# Patient Record
Sex: Female | Born: 1965 | Race: White | Hispanic: No | Smoking: Never smoker
Health system: Southern US, Community
[De-identification: ages and names within clinical notes are randomized; demographics above are authoritative.]

## PROBLEM LIST (undated history)

## (undated) DIAGNOSIS — I10 Essential (primary) hypertension: Secondary | ICD-10-CM

## (undated) DIAGNOSIS — M109 Gout, unspecified: Secondary | ICD-10-CM

## (undated) DIAGNOSIS — F419 Anxiety disorder, unspecified: Secondary | ICD-10-CM

## (undated) DIAGNOSIS — N200 Calculus of kidney: Secondary | ICD-10-CM

## (undated) DIAGNOSIS — K5792 Diverticulitis of intestine, part unspecified, without perforation or abscess without bleeding: Secondary | ICD-10-CM

## (undated) DIAGNOSIS — F329 Major depressive disorder, single episode, unspecified: Secondary | ICD-10-CM

## (undated) DIAGNOSIS — E119 Type 2 diabetes mellitus without complications: Secondary | ICD-10-CM

## (undated) DIAGNOSIS — F32A Depression, unspecified: Secondary | ICD-10-CM

## (undated) HISTORY — PX: CHOLECYSTECTOMY: SHX55

## (undated) HISTORY — PX: NASAL SINUS SURGERY: SHX719

## (undated) HISTORY — DX: Diverticulitis of intestine, part unspecified, without perforation or abscess without bleeding: K57.92

## (undated) HISTORY — PX: ELBOW SURGERY: SHX618

## (undated) HISTORY — PX: OTHER SURGICAL HISTORY: SHX169

## (undated) HISTORY — PX: ABDOMINAL HYSTERECTOMY: SHX81

---

## 2017-12-21 ENCOUNTER — Ambulatory Visit: Payer: Self-pay | Admitting: Unknown Physician Specialty

## 2017-12-31 ENCOUNTER — Encounter: Payer: Self-pay | Admitting: Emergency Medicine

## 2017-12-31 ENCOUNTER — Other Ambulatory Visit: Payer: Self-pay

## 2017-12-31 ENCOUNTER — Emergency Department
Admission: EM | Admit: 2017-12-31 | Discharge: 2017-12-31 | Disposition: A | Discharge status: Home / Self Care | Payer: Medicare HMO | Attending: Emergency Medicine | Admitting: Emergency Medicine

## 2017-12-31 ENCOUNTER — Emergency Department: Payer: Medicare HMO

## 2017-12-31 DIAGNOSIS — I10 Essential (primary) hypertension: Secondary | ICD-10-CM | POA: Diagnosis not present

## 2017-12-31 DIAGNOSIS — E119 Type 2 diabetes mellitus without complications: Secondary | ICD-10-CM | POA: Insufficient documentation

## 2017-12-31 DIAGNOSIS — N200 Calculus of kidney: Secondary | ICD-10-CM | POA: Diagnosis not present

## 2017-12-31 DIAGNOSIS — N939 Abnormal uterine and vaginal bleeding, unspecified: Secondary | ICD-10-CM | POA: Diagnosis present

## 2017-12-31 HISTORY — DX: Depression, unspecified: F32.A

## 2017-12-31 HISTORY — DX: Gout, unspecified: M10.9

## 2017-12-31 HISTORY — DX: Major depressive disorder, single episode, unspecified: F32.9

## 2017-12-31 HISTORY — DX: Calculus of kidney: N20.0

## 2017-12-31 HISTORY — DX: Essential (primary) hypertension: I10

## 2017-12-31 HISTORY — DX: Anxiety disorder, unspecified: F41.9

## 2017-12-31 HISTORY — DX: Type 2 diabetes mellitus without complications: E11.9

## 2017-12-31 LAB — URINALYSIS, COMPLETE (UACMP) WITH MICROSCOPIC
BILIRUBIN URINE: NEGATIVE
Glucose, UA: NEGATIVE mg/dL
KETONES UR: NEGATIVE mg/dL
LEUKOCYTES UA: NEGATIVE
NITRITE: NEGATIVE
PROTEIN: NEGATIVE mg/dL
Specific Gravity, Urine: 1.014 (ref 1.005–1.030)
pH: 5 (ref 5.0–8.0)

## 2017-12-31 LAB — WET PREP, GENITAL
Clue Cells Wet Prep HPF POC: NONE SEEN
Sperm: NONE SEEN
TRICH WET PREP: NONE SEEN
Yeast Wet Prep HPF POC: NONE SEEN

## 2017-12-31 LAB — CHLAMYDIA/NGC RT PCR (ARMC ONLY)
Chlamydia Tr: NOT DETECTED
N gonorrhoeae: NOT DETECTED

## 2017-12-31 LAB — POCT PREGNANCY, URINE: PREG TEST UR: NEGATIVE

## 2017-12-31 NOTE — ED Notes (Signed)
Signature pad not working, pt verbalizes understanding of discharge and follow up instructions 

## 2017-12-31 NOTE — ED Triage Notes (Signed)
Pt to ED c/o vaginal bleeding last night but none today, denies clots.  States had complete hysterectomy 9 years ago.  Denies weakness or lightheaded, skin color appropriate.

## 2017-12-31 NOTE — ED Notes (Signed)
Pt presents today for vaginal bleeding that started this morning. Pt denies clots and states she had a hysterectomy about 9 yrs ago;.Marland Kitchen

## 2017-12-31 NOTE — ED Provider Notes (Signed)
[facility] Emergency Department Provider Note       Time seen: ----------------------------------------- 6:03 PM on 12/31/2017 -----------------------------------------   I have reviewed the triage vital signs and the nursing notes.  HISTORY   Chief Complaint Vaginal Bleeding    HPI Wendy Rivas is a 52 y.o. female with a history of anxiety, depression, diabetes, gout, hypertension and kidney stones who presents to the ED for last night but none today.  She denies any clots.  Patient states she had a complete hysterectomy 9 years ago.  She denies weakness, lightheadedness or other complaints at this time.  Past Medical History:  Diagnosis Date  . Anxiety   . Depression   . Diabetes mellitus without complication (HCC)   . Gout   . Hypertension   . Kidney stones     There are no active problems to display for this patient.   Past Surgical History:  Procedure Laterality Date  . ABDOMINAL HYSTERECTOMY    . CESAREAN SECTION    . CHOLECYSTECTOMY    . ELBOW SURGERY      Allergies Januvia [sitagliptin]  Social History Social History   Tobacco Use  . Smoking status: Never Smoker  . Smokeless tobacco: Never Used  Substance Use Topics  . Alcohol use: No    Frequency: Never  . Drug use: No    Review of Systems Constitutional: Negative for fever. Cardiovascular: Negative for chest pain. Respiratory: Negative for shortness of breath. Gastrointestinal: Negative for abdominal pain, vomiting and diarrhea. Genitourinary: Positive for vaginal bleeding Musculoskeletal: Negative for back pain. Skin: Negative for rash. Neurological: Negative for headaches, focal weakness or numbness.  All systems negative/normal/unremarkable except as stated in the HPI  ____________________________________________   PHYSICAL EXAM:  VITAL SIGNS: ED Triage Vitals  Enc Vitals Group     BP 12/31/17 1754 126/81     Pulse Rate 12/31/17 1754 81     Resp  12/31/17 1754 16     Temp 12/31/17 1754 98.9 F (37.2 C)     Temp Source 12/31/17 1754 Oral     SpO2 12/31/17 1754 95 %     Weight 12/31/17 1755 185 lb (83.9 kg)     Height 12/31/17 1755 5\' 8"  (1.727 m)     Head Circumference --      Peak Flow --      Pain Score 12/31/17 1755 3     Pain Loc --      Pain Edu? --      Excl. in GC? --     Constitutional: Alert and oriented. Well appearing and in no distress. Eyes: Conjunctivae are normal. Normal extraocular movements. Cardiovascular: Normal rate, regular rhythm. No murmurs, rubs, or gallops. Respiratory: Normal respiratory effort without tachypnea nor retractions. Breath sounds are clear and equal bilaterally. No wheezes/rales/rhonchi. Gastrointestinal: Soft and nontender. Normal bowel sounds Genitourinary: No vaginal bleeding, no vaginal irritation or tears.  Vaginal cuff is intact, scant vaginal discharge Musculoskeletal: Nontender with normal range of motion in extremities. No lower extremity tenderness nor edema. Neurologic:  Normal speech and language. No gross focal neurologic deficits are appreciated.  Skin:  Skin is warm, dry and intact. No rash noted. Psychiatric: Mood and affect are normal. Speech and behavior are normal.  ____________________________________________  ED COURSE:  As part of my medical decision making, I reviewed the following data within the electronic MEDICAL RECORD NUMBER History obtained from family if available, nursing notes, old chart and ekg, as well as notes from prior ED  visits. Patient presented for vaginal bleeding, we will assess with labs and imaging as indicated at this time.   Procedures ____________________________________________   LABS (pertinent positives/negatives)  Labs Reviewed  WET PREP, GENITAL - Abnormal; Notable for the following components:      Result Value   WBC, Wet Prep HPF POC FEW (*)    All other components within normal limits  CHLAMYDIA/NGC RT PCR ([facility] ONLY)   POCT PREGNANCY, URINE    RADIOLOGY Images were viewed by me  Pelvic ultrasound is unremarkable  ____________________________________________  DIFFERENTIAL DIAGNOSIS   Vaginal dehiscence, vaginal laceration, urinary bleeding, rectal bleeding  FINAL ASSESSMENT AND PLAN  Abnormal vaginal bleeding   Plan: Patient had presented for abnormal vaginal bleeding. Patient's labs are unremarkable. Patient's imaging was also unremarkable.  No clear etiology for her symptoms.  She is cleared for outpatient follow-up.   Ulice Dash, MD   Note: This note was generated in part or whole with voice recognition software. Voice recognition is usually quite accurate but there are transcription errors that can and very often do occur. I apologize for any typographical errors that were not detected and corrected.     Emily Filbert, MD 12/31/17 [phone]

## 2018-01-02 LAB — URINE CULTURE: SPECIAL REQUESTS: NORMAL

## 2018-02-20 ENCOUNTER — Encounter: Payer: Self-pay | Admitting: Emergency Medicine

## 2018-02-20 ENCOUNTER — Emergency Department: Payer: Medicare HMO

## 2018-02-20 ENCOUNTER — Emergency Department
Admission: EM | Admit: 2018-02-20 | Discharge: 2018-02-20 | Disposition: A | Discharge status: Home / Self Care | Payer: Medicare HMO | Attending: Emergency Medicine | Admitting: Emergency Medicine

## 2018-02-20 DIAGNOSIS — M25552 Pain in left hip: Secondary | ICD-10-CM

## 2018-02-20 DIAGNOSIS — M7062 Trochanteric bursitis, left hip: Secondary | ICD-10-CM | POA: Insufficient documentation

## 2018-02-20 DIAGNOSIS — Y9301 Activity, walking, marching and hiking: Secondary | ICD-10-CM | POA: Diagnosis not present

## 2018-02-20 DIAGNOSIS — I10 Essential (primary) hypertension: Secondary | ICD-10-CM | POA: Diagnosis not present

## 2018-02-20 DIAGNOSIS — E119 Type 2 diabetes mellitus without complications: Secondary | ICD-10-CM | POA: Diagnosis not present

## 2018-02-20 LAB — URINALYSIS, COMPLETE (UACMP) WITH MICROSCOPIC
Bacteria, UA: NONE SEEN
Bilirubin Urine: NEGATIVE
Glucose, UA: NEGATIVE mg/dL
Hgb urine dipstick: NEGATIVE
Ketones, ur: NEGATIVE mg/dL
Leukocytes, UA: NEGATIVE
Nitrite: NEGATIVE
Protein, ur: NEGATIVE mg/dL
Specific Gravity, Urine: 1.017 (ref 1.005–1.030)
pH: 5 (ref 5.0–8.0)

## 2018-02-20 MED ORDER — PREDNISONE 10 MG PO TABS: 10.0000 mg | ORAL_TABLET | Freq: Every day | ORAL | 0 refills | Status: DC

## 2018-02-20 MED ORDER — KETOROLAC TROMETHAMINE 30 MG/ML IJ SOLN
30.0000 mg | Freq: Once | INTRAMUSCULAR | Status: AC
  Administered 2018-02-20: 30 mg via INTRAMUSCULAR
  Filled 2018-02-20: qty 1

## 2018-02-20 NOTE — Discharge Instructions (Addendum)
Please apply heating pad to the left hip.  Take prednisone as prescribed.  After prednisone take ibuprofen 600 mg 3 times daily with food.  Follow-up with orthopedics if no improvement 1 week.

## 2018-02-20 NOTE — ED Triage Notes (Signed)
Pt reports pain to left hip for the past few days, hurts worse when she walks and stands.

## 2018-02-20 NOTE — ED Provider Notes (Signed)
[facility] EMERGENCY DEPARTMENT Provider Note   CSN: [id number] Arrival date & time: 02/20/18  4098     History   Chief Complaint Chief Complaint  Patient presents with  . Hip Pain  . Pelvic Pain    HPI Wendy Rivas is a 52 y.o. female.  Presents to the emergency department for evaluation of left lateral hip pain, left groin pain.  Symptoms have been present on and off for 1 year but over the last 3 weeks she has been working 9-hour shifts daily with no breaks.  Walking on concrete for 9 hours has caused increased pain.  She has not had a day off in 3 weeks.  She describes aching pain in the left groin and left lateral hip.  She also has a pain at nighttime along the left trochanteric bursa.  She denies any back pain, trauma, injury, numbness, tingling, radicular symptoms.  She has taken ibuprofen with only mild improvement.  She is able to ambulate with no assistive devices.  She denies any abdominal pain, flank pain, urinary symptoms.  HPI  Past Medical History:  Diagnosis Date  . Anxiety   . Depression   . Diabetes mellitus without complication (HCC)   . Gout   . Hypertension   . Kidney stones     There are no active problems to display for this patient.   Past Surgical History:  Procedure Laterality Date  . ABDOMINAL HYSTERECTOMY    . CESAREAN SECTION    . CHOLECYSTECTOMY    . ELBOW SURGERY       OB History   None      Home Medications    Prior to Admission medications   Medication Sig Start Date End Date Taking? Authorizing Provider  predniSONE (DELTASONE) 10 MG tablet Take 1 tablet (10 mg total) by mouth daily. 6,5,4,3,2,1 six day taper 02/20/18   Evon Slack, PA-C    Family History No family history on file.  Social History Social History   Tobacco Use  . Smoking status: Never Smoker  . Smokeless tobacco: Never Used  Substance Use Topics  . Alcohol use: No    Frequency: Never  . Drug use: No     Allergies     Januvia [sitagliptin]   Review of Systems Review of Systems  Constitutional: Negative for fever.  Respiratory: Negative for shortness of breath.   Cardiovascular: Negative for chest pain.  Gastrointestinal: Negative for abdominal pain.  Genitourinary: Negative for difficulty urinating, dysuria and urgency.  Musculoskeletal: Positive for arthralgias. Negative for back pain, gait problem, joint swelling and myalgias.  Skin: Negative for rash.  Neurological: Negative for dizziness and headaches.     Physical Exam Updated Vital Signs BP 118/79 (BP Location: Left Arm)   Pulse 73   Temp 98.6 F (37 C) (Oral)   Resp 20   Ht 5\' 8"  (1.727 m)   Wt 86.2 kg (190 lb)   SpO2 96%   BMI 28.89 kg/m   Physical Exam  Constitutional: She is oriented to person, place, and time. She appears well-developed and well-nourished.  HENT:  Head: Normocephalic and atraumatic.  Eyes: Conjunctivae are normal.  Neck: Normal range of motion.  Cardiovascular: Normal rate.  Pulmonary/Chest: Effort normal. No respiratory distress.  Musculoskeletal:  Examination lumbar spine shows no spinous process tenderness.  She has tenderness of the left hip trochanter bursa with slight pain with left hip internal rotation.  She has full range of motion of left hip she  is nervous intact in bilateral lower extremities.  She is nontender along the sciatic notches, sacral region.  Neurological: She is alert and oriented to person, place, and time.  Skin: Skin is warm. No rash noted.  Psychiatric: She has a normal mood and affect. Her behavior is normal. Thought content normal.     ED Treatments / Results  Labs (all labs ordered are listed, but only abnormal results are displayed) Labs Reviewed  URINALYSIS, COMPLETE (UACMP) WITH MICROSCOPIC - Abnormal; Notable for the following components:      Result Value   Color, Urine YELLOW (*)    APPearance HAZY (*)    Squamous Epithelial / LPF 6-30 (*)    All other  components within normal limits    EKG None  Radiology Dg Hip Unilat W Or Wo Pelvis 2-3 Views Left  Result Date: 02/20/2018 CLINICAL DATA:  52 year old female with pain radiating from the left groin to the lateral hip after beginning new job and walking on concrete floors. EXAM: DG HIP (WITH OR WITHOUT PELVIS) 2-3V LEFT COMPARISON:  None. FINDINGS: Bone mineralization is within normal limits. Femoral heads are normally located. The hip joint spaces appear symmetric and within normal limits. Intact pelvis. Sacral ala and SI joints appear normal. Incidental pelvic phleboliths. Proximal left femur appears intact and normal. There are degenerative changes in the lower lumbar spine. Right lower quadrant surgical clip incidentally noted. Negative visible bowel gas pattern. IMPRESSION: 1. Normal for age radiographic appearance of the left hip and pelvis. 2. Lower lumbar spine degeneration. Electronically Signed   By: Odessa FlemingH  Hall M.D.   On: 02/20/2018 10:03    Procedures Procedures (including critical care time)  Medications Ordered in ED Medications  ketorolac (TORADOL) 30 MG/ML injection 30 mg (30 mg Intramuscular Given 02/20/18 1002)     Initial Impression / Assessment and Plan / ED Course  I have reviewed the triage vital signs and the nursing notes.  Pertinent labs & imaging results that were available during my care of the patient were reviewed by me and considered in my medical decision making (see chart for details).     52 year old female with left hip pain.  She points to the left hip trochanteric bursa as well as the groin.  Normal range of motion of left hip with only mild discomfort with hip internal rotation.  She is point tender over the trochanteric bursa.  Patient had x-rays today, these reviewed by me today showing mild arthritic changes present.  Patient also urinalysis which was reviewed by me today showing no sign of blood or infection.  Physical exam and urinalysis not consistent  with kidney stone.  Patient started on 6-day steroid taper.  She will be given a note to remain out of work today.  She has the next 2 days off and will rest.  She will follow with orthopedics if no improvement in 1 week.  She will avoid lying on left hip at nighttime.  Final Clinical Impressions(s) / ED Diagnoses   Final diagnoses:  Left hip pain  Trochanteric bursitis of left hip    ED Discharge Orders        Ordered    predniSONE (DELTASONE) 10 MG tablet  Daily     02/20/18 1024       Ronnette JuniperGaines, Thomas C, PA-C 02/20/18 1030    Darci CurrentBrown, [place] N, MD 02/20/18 1043

## 2018-02-20 NOTE — ED Notes (Signed)

## 2018-04-01 ENCOUNTER — Emergency Department: Payer: Medicare HMO

## 2018-04-01 ENCOUNTER — Emergency Department
Admission: EM | Admit: 2018-04-01 | Discharge: 2018-04-01 | Disposition: A | Discharge status: Home / Self Care | Payer: Medicare HMO | Attending: Emergency Medicine | Admitting: Emergency Medicine

## 2018-04-01 ENCOUNTER — Other Ambulatory Visit: Payer: Self-pay

## 2018-04-01 ENCOUNTER — Encounter: Payer: Self-pay | Admitting: Emergency Medicine

## 2018-04-01 DIAGNOSIS — E119 Type 2 diabetes mellitus without complications: Secondary | ICD-10-CM | POA: Diagnosis not present

## 2018-04-01 DIAGNOSIS — K5792 Diverticulitis of intestine, part unspecified, without perforation or abscess without bleeding: Secondary | ICD-10-CM | POA: Diagnosis not present

## 2018-04-01 DIAGNOSIS — I10 Essential (primary) hypertension: Secondary | ICD-10-CM | POA: Insufficient documentation

## 2018-04-01 DIAGNOSIS — R1032 Left lower quadrant pain: Secondary | ICD-10-CM | POA: Diagnosis present

## 2018-04-01 LAB — COMPREHENSIVE METABOLIC PANEL
ALBUMIN: 4 g/dL (ref 3.5–5.0)
ALT: 13 U/L — ABNORMAL LOW (ref 14–54)
ANION GAP: 10 (ref 5–15)
AST: 36 U/L (ref 15–41)
Alkaline Phosphatase: 51 U/L (ref 38–126)
BUN: 10 mg/dL (ref 6–20)
CO2: 25 mmol/L (ref 22–32)
Calcium: 8.6 mg/dL — ABNORMAL LOW (ref 8.9–10.3)
Chloride: 104 mmol/L (ref 101–111)
Creatinine, Ser: 0.77 mg/dL (ref 0.44–1.00)
GFR calc Af Amer: >60 mL/min (ref >60)
GFR calc non Af Amer: >60 mL/min (ref >60)
GLUCOSE: 226 mg/dL — AB (ref 65–99)
POTASSIUM: 3.7 mmol/L (ref 3.5–5.1)
Sodium: 139 mmol/L (ref 135–145)
TOTAL PROTEIN: 6.7 g/dL (ref 6.5–8.1)
Total Bilirubin: 0.8 mg/dL (ref 0.3–1.2)

## 2018-04-01 LAB — CBC WITH DIFFERENTIAL/PLATELET
BASOS ABS: 0 10*3/uL (ref 0–0.1)
BASOS PCT: 1 %
Eosinophils Absolute: 0.2 10*3/uL (ref 0–0.7)
Eosinophils Relative: 2 %
HEMATOCRIT: 33.6 % — AB (ref 35.0–47.0)
Hemoglobin: 11.8 g/dL — ABNORMAL LOW (ref 12.0–16.0)
Lymphocytes Relative: 18 %
Lymphs Abs: 1.2 10*3/uL (ref 1.0–3.6)
MCH: 30 pg (ref 26.0–34.0)
MCHC: 35.1 g/dL (ref 32.0–36.0)
MCV: 85.4 fL (ref 80.0–100.0)
MONO ABS: 0.4 10*3/uL (ref 0.2–0.9)
Monocytes Relative: 6 %
NEUTROS ABS: 4.8 10*3/uL (ref 1.4–6.5)
Neutrophils Relative %: 73 %
PLATELETS: 227 10*3/uL (ref 150–440)
RBC: 3.93 MIL/uL (ref 3.80–5.20)
RDW: 13.2 % (ref 11.5–14.5)
WBC: 6.6 10*3/uL (ref 3.6–11.0)

## 2018-04-01 LAB — URINALYSIS, COMPLETE (UACMP) WITH MICROSCOPIC
BACTERIA UA: NONE SEEN
BILIRUBIN URINE: NEGATIVE
Glucose, UA: NEGATIVE mg/dL
HGB URINE DIPSTICK: NEGATIVE
Ketones, ur: NEGATIVE mg/dL
LEUKOCYTES UA: NEGATIVE
NITRITE: NEGATIVE
Protein, ur: NEGATIVE mg/dL
SPECIFIC GRAVITY, URINE: 1.016 (ref 1.005–1.030)
pH: 5 (ref 5.0–8.0)

## 2018-04-01 LAB — LIPASE, BLOOD: Lipase: 38 U/L (ref 11–51)

## 2018-04-01 MED ORDER — RANITIDINE HCL 150 MG PO TABS: 150.0000 mg | ORAL_TABLET | Freq: Two times a day (BID) | ORAL | 2 refills | Status: DC

## 2018-04-01 MED ORDER — SERTRALINE HCL 100 MG PO TABS: 100.0000 mg | ORAL_TABLET | Freq: Every day | ORAL | 2 refills | Status: DC

## 2018-04-01 MED ORDER — METRONIDAZOLE 500 MG PO TABS: 500.0000 mg | ORAL_TABLET | Freq: Three times a day (TID) | ORAL | 0 refills | Status: DC

## 2018-04-01 MED ORDER — ALLOPURINOL 100 MG PO TABS: 100.0000 mg | ORAL_TABLET | Freq: Every day | ORAL | 3 refills | Status: DC

## 2018-04-01 MED ORDER — LISINOPRIL 2.5 MG PO TABS: 2.5000 mg | ORAL_TABLET | Freq: Every day | ORAL | 3 refills | Status: DC

## 2018-04-01 MED ORDER — KETOROLAC TROMETHAMINE 30 MG/ML IJ SOLN
30.0000 mg | Freq: Once | INTRAMUSCULAR | Status: AC
  Administered 2018-04-01: 30 mg via INTRAVENOUS
  Filled 2018-04-01: qty 1

## 2018-04-01 MED ORDER — OXYCODONE-ACETAMINOPHEN 5-325 MG PO TABS: 1.0000 | ORAL_TABLET | Freq: Three times a day (TID) | ORAL | 0 refills | Status: DC | PRN

## 2018-04-01 MED ORDER — TRAZODONE HCL 50 MG PO TABS: 100.0000 mg | ORAL_TABLET | Freq: Every day | ORAL | 2 refills | Status: DC

## 2018-04-01 MED ORDER — CIPROFLOXACIN HCL 500 MG PO TABS: 500.0000 mg | ORAL_TABLET | Freq: Two times a day (BID) | ORAL | 0 refills | Status: AC

## 2018-04-01 NOTE — ED Provider Notes (Signed)
[facility] Emergency Department Provider Note       Time seen: ----------------------------------------- 9:25 AM on 04/01/2018 -----------------------------------------   I have reviewed the triage vital signs and the nursing notes.  HISTORY   Chief Complaint Abdominal Pain    HPI Wendy Rivas is a 52 y.o. female with a history of anxiety, depression, diabetes, gout, hypertension and kidney stones who presents to the ED for abdominal pain that started yesterday.  Patient states she had nausea but no vomiting or diarrhea.  She denies any fever, she does report some dysuria and urinary frequency.  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  She reports pain is worse with movement, worse on the left side but she is also having right lower quadrant pain.  Past Medical History:  Diagnosis Date  . Anxiety   . Depression   . Diabetes mellitus without complication (HCC)   . Gout   . Hypertension   . Kidney stones     There are no active problems to display for this patient.   Past Surgical History:  Procedure Laterality Date  . ABDOMINAL HYSTERECTOMY    . CESAREAN SECTION    . CHOLECYSTECTOMY    . ELBOW SURGERY      Allergies Januvia [sitagliptin]  Social History Social History   Tobacco Use  . Smoking status: Never Smoker  . Smokeless tobacco: Never Used  Substance Use Topics  . Alcohol use: No    Frequency: Never  . Drug use: No   Review of Systems Constitutional: Negative for fever. Cardiovascular: Negative for chest pain. Respiratory: Negative for shortness of breath. Gastrointestinal: Positive for abdominal pain Genitourinary: Positive for dysuria Musculoskeletal: Positive for back pain Skin: Negative for rash. Neurological: Negative for headaches, focal weakness or numbness.  All systems negative/normal/unremarkable except as  stated in the HPI  ____________________________________________   PHYSICAL EXAM:  VITAL SIGNS: ED Triage Vitals  Enc Vitals Group     BP 04/01/18 0908 123/74     Pulse Rate 04/01/18 0908 84     Resp 04/01/18 0908 18     Temp 04/01/18 0908 98.3 F (36.8 C)     Temp Source 04/01/18 0908 Oral     SpO2 04/01/18 0908 95 %     Weight 04/01/18 0915 190 lb (86.2 kg)     Height 04/01/18 0915  (1.727 m)     Head Circumference --      Peak Flow --      Pain Score 04/01/18 0914 9     Pain Loc --      Pain Edu? --      Excl. in GC? --    Constitutional: Alert and oriented. Well appearing and in no distress. Eyes: Conjunctivae are normal. Normal extraocular movements. ENT   Head: Normocephalic and atraumatic.   Nose: No congestion/rhinnorhea.   Mouth/Throat: Mucous membranes are moist.   Neck: No stridor. Cardiovascular: Normal rate, regular rhythm. No murmurs, rubs, or gallops. Respiratory: Normal respiratory effort without tachypnea nor retractions. Breath sounds are clear and equal bilaterally. No wheezes/rales/rhonchi. Gastrointestinal: Left flank tenderness, no rebound or guarding.  Normal bowel sounds. Musculoskeletal: Nontender with normal range of motion in extremities. No lower extremity tenderness nor edema. Neurologic:  Normal speech and language. No gross focal neurologic deficits are appreciated.  Skin:  Skin is warm, dry and intact. No rash noted. Psychiatric: Mood and affect are  normal. Speech and behavior are normal.  ____________________________________________  ED COURSE:  As part of my medical decision making, I reviewed the following data within the electronic MEDICAL RECORD NUMBER History obtained from family if available, nursing notes, old chart and ekg, as well as notes from prior ED visits. Patient presented for flank pain, we will assess with labs and imaging as indicated at this time.   Procedures ____________________________________________    LABS (pertinent positives/negatives)  Labs Reviewed  CBC WITH DIFFERENTIAL/PLATELET - Abnormal; Notable for the following components:      Result Value   Hemoglobin 11.8 (*)    HCT 33.6 (*)    All other components within normal limits  COMPREHENSIVE METABOLIC PANEL - Abnormal; Notable for the following components:   Glucose, Bld 226 (*)    Calcium 8.6 (*)    ALT 13 (*)    All other components within normal limits  URINALYSIS, COMPLETE (UACMP) WITH MICROSCOPIC - Abnormal; Notable for the following components:   Color, Urine YELLOW (*)    APPearance CLEAR (*)    All other components within normal limits  LIPASE, BLOOD    RADIOLOGY Images were viewed by me  cT renal protocol IMPRESSION: 1. Extensive colonic diverticulosis with distal descending diverticulitis, no abscess. 2. Left renal parenchymal loss suggesting renal artery stenosis or prior insult. 3. Right nephrolithiasis.  No hydronephrosis.  ____________________________________________  DIFFERENTIAL DIAGNOSIS   Renal colic, UTI, pyelonephritis, muscle strain  FINAL ASSESSMENT AND PLAN  Diverticulitis, medication refill   Plan: The patient had presented for flank pain. Patient's labs are grossly unremarkable except for mild hyperglycemia. Patient's imaging did reveal distal descending diverticulitis without abscess.  She was started on Cipro and Flagyl as well as pain medicine.  She is cleared for outpatient follow-up.  I have also refilled some of her chronic medications.   Ulice Dash, MD   Note: This note was generated in part or whole with voice recognition software. Voice recognition is usually quite accurate but there are transcription errors that can and very often do occur. I apologize for any typographical errors that were not detected and corrected.     Emily Filbert, MD 04/01/18 1224

## 2018-04-01 NOTE — ED Triage Notes (Signed)
Pt to ED via POV c/o LLQ abdominal pain that started yesterday. Pt states that she has had nausea but no vomiting or diarrhea. Pt denies fever. Pt states that she has had some dysuria and frequency. Pt reports hx/o kidney stones, states that does not feel like her pain from kidney stones. Pt reports that her pain is worse with movement. Pt is in NAD at this time.

## 2018-04-05 ENCOUNTER — Encounter: Payer: Self-pay | Admitting: Emergency Medicine

## 2018-04-05 ENCOUNTER — Other Ambulatory Visit: Payer: Self-pay

## 2018-04-05 DIAGNOSIS — Z79899 Other long term (current) drug therapy: Secondary | ICD-10-CM | POA: Insufficient documentation

## 2018-04-05 DIAGNOSIS — E119 Type 2 diabetes mellitus without complications: Secondary | ICD-10-CM | POA: Diagnosis not present

## 2018-04-05 DIAGNOSIS — K5732 Diverticulitis of large intestine without perforation or abscess without bleeding: Secondary | ICD-10-CM | POA: Diagnosis not present

## 2018-04-05 DIAGNOSIS — F419 Anxiety disorder, unspecified: Secondary | ICD-10-CM | POA: Insufficient documentation

## 2018-04-05 DIAGNOSIS — F329 Major depressive disorder, single episode, unspecified: Secondary | ICD-10-CM | POA: Insufficient documentation

## 2018-04-05 DIAGNOSIS — R1032 Left lower quadrant pain: Secondary | ICD-10-CM | POA: Diagnosis present

## 2018-04-05 DIAGNOSIS — I10 Essential (primary) hypertension: Secondary | ICD-10-CM | POA: Diagnosis not present

## 2018-04-05 DIAGNOSIS — Z9049 Acquired absence of other specified parts of digestive tract: Secondary | ICD-10-CM | POA: Diagnosis not present

## 2018-04-05 LAB — COMPREHENSIVE METABOLIC PANEL
ALK PHOS: 64 U/L (ref 38–126)
ALT: 16 U/L (ref 14–54)
AST: 28 U/L (ref 15–41)
Albumin: 4.7 g/dL (ref 3.5–5.0)
Anion gap: 11 (ref 5–15)
BILIRUBIN TOTAL: 0.4 mg/dL (ref 0.3–1.2)
BUN: 18 mg/dL (ref 6–20)
CALCIUM: 8.9 mg/dL (ref 8.9–10.3)
CHLORIDE: 103 mmol/L (ref 101–111)
CO2: 24 mmol/L (ref 22–32)
CREATININE: 1 mg/dL (ref 0.44–1.00)
Glucose, Bld: 218 mg/dL — ABNORMAL HIGH (ref 65–99)
Potassium: 3.4 mmol/L — ABNORMAL LOW (ref 3.5–5.1)
Sodium: 138 mmol/L (ref 135–145)
TOTAL PROTEIN: 7.4 g/dL (ref 6.5–8.1)

## 2018-04-05 LAB — URINALYSIS, COMPLETE (UACMP) WITH MICROSCOPIC
Bacteria, UA: NONE SEEN
Bilirubin Urine: NEGATIVE
GLUCOSE, UA: NEGATIVE mg/dL
HGB URINE DIPSTICK: NEGATIVE
KETONES UR: NEGATIVE mg/dL
NITRITE: NEGATIVE
PH: 5 (ref 5.0–8.0)
PROTEIN: NEGATIVE mg/dL
Specific Gravity, Urine: 1.016 (ref 1.005–1.030)

## 2018-04-05 LAB — CBC
HCT: 36.7 % (ref 35.0–47.0)
Hemoglobin: 12.9 g/dL (ref 12.0–16.0)
MCH: 29.5 pg (ref 26.0–34.0)
MCHC: 35 g/dL (ref 32.0–36.0)
MCV: 84.4 fL (ref 80.0–100.0)
PLATELETS: 306 10*3/uL (ref 150–440)
RBC: 4.35 MIL/uL (ref 3.80–5.20)
RDW: 13.1 % (ref 11.5–14.5)
WBC: 6.9 10*3/uL (ref 3.6–11.0)

## 2018-04-05 LAB — LIPASE, BLOOD: LIPASE: 50 U/L (ref 11–51)

## 2018-04-05 NOTE — ED Triage Notes (Signed)
Patient to ER for c/o LLQ abd pain. Was seen here on 5/24 and diagnosed with diverticulitis. States she has not missed any doses of antibiotics, doesn't feel like pain is getting any better.

## 2018-04-06 ENCOUNTER — Emergency Department
Admission: EM | Admit: 2018-04-06 | Discharge: 2018-04-06 | Disposition: A | Discharge status: Home / Self Care | Payer: Medicare HMO | Attending: Emergency Medicine | Admitting: Emergency Medicine

## 2018-04-06 DIAGNOSIS — K5732 Diverticulitis of large intestine without perforation or abscess without bleeding: Secondary | ICD-10-CM

## 2018-04-06 NOTE — ED Provider Notes (Signed)
[facility] Emergency Department Provider Note    First MD Initiated Contact with Patient 04/06/18 [phone]     (approximate)  I have reviewed the triage vital signs and the nursing notes.   HISTORY  Chief Complaint Abdominal Pain    HPI Wendy Rivas is a 52 y.o. female below list of chronic medical conditions including recently diagnosed diverticulitis on 524/2019 return to the emergency department with concern that symptoms have not completely resolved.  Patient states that she has had diverticulitis in the past and after 5 days of antibiotic symptoms are usually resolved.  Patient states the pain is improved but not completely resolved.  Patient denies any fever no bright red blood per rectum.  Patient states that she is been unable to return to work secondary to discomfort and requires a work note.  Patient describes current pain as well.  Past Medical History:  Diagnosis Date  . Anxiety   . Depression   . Diabetes mellitus without complication (HCC)   . Gout   . Hypertension   . Kidney stones     There are no active problems to display for this patient.   Past Surgical History:  Procedure Laterality Date  . ABDOMINAL HYSTERECTOMY    . CESAREAN SECTION    . CHOLECYSTECTOMY    . ELBOW SURGERY      Prior to Admission medications   Medication Sig Start Date End Date Taking? Authorizing Provider  acetaminophen (TYLENOL) 500 MG tablet Take 500-1,000 mg by mouth every 6 (six) hours as needed for mild pain or moderate pain.    [provider]  allopurinol (ZYLOPRIM) 100 MG tablet Take 1 tablet (100 mg total) by mouth daily. 04/01/18   Emily Filbert, MD  ciprofloxacin (CIPRO) 500 MG tablet Take 1 tablet (500 mg total) by mouth 2 (two) times daily for 10 days. 04/01/18 04/11/18  Emily Filbert, MD  lisinopril (PRINIVIL,ZESTRIL) 2.5 MG tablet Take 1 tablet (2.5 mg total) by mouth daily. 04/01/18   Emily Filbert, MD  metroNIDAZOLE  (FLAGYL) 500 MG tablet Take 1 tablet (500 mg total) by mouth 3 (three) times daily. 04/01/18   Emily Filbert, MD  oxyCODONE-acetaminophen (PERCOCET) 5-325 MG tablet Take 1 tablet by mouth every 8 (eight) hours as needed. 04/01/18   Emily Filbert, MD  ranitidine (ZANTAC) 150 MG tablet Take 1 tablet (150 mg total) by mouth 2 (two) times daily. 04/01/18   Emily Filbert, MD  sertraline (ZOLOFT) 100 MG tablet Take 1 tablet (100 mg total) by mouth daily. 04/01/18   Emily Filbert, MD  traZODone (DESYREL) 50 MG tablet Take 2 tablets (100 mg total) by mouth at bedtime. 04/01/18   Emily Filbert, MD    Allergies Januvia [sitagliptin]  No family history on file.  Social History Social History   Tobacco Use  . Smoking status: Never Smoker  . Smokeless tobacco: Never Used  Substance Use Topics  . Alcohol use: No    Frequency: Never  . Drug use: No    Review of Systems Constitutional: No fever/chills Eyes: No visual changes. ENT: No sore throat. Cardiovascular: Denies chest pain. Respiratory: Denies shortness of breath. Gastrointestinal: Positive for abdominal pain no nausea, no vomiting.  No diarrhea.  No constipation. Genitourinary: Negative for dysuria. Musculoskeletal: Negative for neck pain.  Negative for back pain. Integumentary: Negative for rash. Neurological: Negative for headaches, focal weakness or numbness.  ____________________________________________   PHYSICAL EXAM:  VITAL SIGNS: ED  Triage Vitals [04/05/18 2209]  Enc Vitals Group     BP 124/79     Pulse Rate 97     Resp 20     Temp 98.7 F (37.1 C)     Temp Source Oral     SpO2 97 %     Weight 86.2 kg (190 lb)     Height 1.727 m ( )     Head Circumference      Peak Flow      Pain Score 9     Pain Loc      Pain Edu?      Excl. in GC?     Constitutional: Alert and oriented. Well appearing and in no acute distress. Eyes: Conjunctivae are normal Head:  Atraumatic. Mouth/Throat: Mucous membranes are moist Neck: No stridor.   Cardiovascular: Normal rate, regular rhythm. Good peripheral circulation. Grossly normal heart sounds. Respiratory: Normal respiratory effort.  No retractions. Lungs CTAB. Gastrointestinal: Left lower quadrant tenderness to palpation.  No distention.  Musculoskeletal: No lower extremity tenderness nor edema. No gross deformities of extremities. Neurologic:  Normal speech and language. No gross focal neurologic deficits are appreciated.  Skin:  Skin is warm, dry and intact. No rash noted. Psychiatric: Mood and affect are normal. Speech and behavior are normal.  ____________________________________________   LABS (all labs ordered are listed, but only abnormal results are displayed)  Labs Reviewed  COMPREHENSIVE METABOLIC PANEL - Abnormal; Notable for the following components:      Result Value   Potassium 3.4 (*)    Glucose, Bld 218 (*)    All other components within normal limits  URINALYSIS, COMPLETE (UACMP) WITH MICROSCOPIC - Abnormal; Notable for the following components:   Color, Urine YELLOW (*)    APPearance CLEAR (*)    Leukocytes, UA TRACE (*)    All other components within normal limits  LIPASE, BLOOD  CBC       Procedures   ____________________________________________   INITIAL IMPRESSION / ASSESSMENT AND PLAN / ED COURSE  As part of my medical decision making, I reviewed the following data within the electronic MEDICAL RECORD NUMBER   52 year old female present with above-stated history and physical exam secondary to left lower quadrant pain with recently diagnosed diverticulitis.  Consider the possibility of diverticulitis complication including abscess or perforation.  Patient has no evidence of peritonitis laboratory data unremarkable including normal WBC count.  Spoke with the patient at length regarding possibility of reimaging via CAT scan however patient made a joint decision to forego CT  scan at this time.  I advised patient if pain were to worsen fever or any other concerns to return to the emergency department.  ___________________________  FINAL CLINICAL IMPRESSION(S) / ED DIAGNOSES  Final diagnoses:  Diverticulitis of large intestine without perforation or abscess without bleeding     MEDICATIONS GIVEN DURING THIS VISIT:  Medications - No data to display   ED Discharge Orders    None       Note:  This document was prepared using Dragon voice recognition software and may include unintentional dictation errors.    Darci Current, MD 04/06/18 [phone]

## 2018-05-29 ENCOUNTER — Encounter: Payer: Self-pay | Admitting: Emergency Medicine

## 2018-05-29 ENCOUNTER — Emergency Department
Admission: EM | Admit: 2018-05-29 | Discharge: 2018-05-29 | Disposition: A | Discharge status: Home / Self Care | Payer: Medicare HMO | Attending: Emergency Medicine | Admitting: Emergency Medicine

## 2018-05-29 ENCOUNTER — Other Ambulatory Visit: Payer: Self-pay

## 2018-05-29 DIAGNOSIS — R638 Other symptoms and signs concerning food and fluid intake: Secondary | ICD-10-CM | POA: Insufficient documentation

## 2018-05-29 DIAGNOSIS — R110 Nausea: Secondary | ICD-10-CM | POA: Insufficient documentation

## 2018-05-29 DIAGNOSIS — E119 Type 2 diabetes mellitus without complications: Secondary | ICD-10-CM | POA: Insufficient documentation

## 2018-05-29 DIAGNOSIS — I10 Essential (primary) hypertension: Secondary | ICD-10-CM | POA: Diagnosis not present

## 2018-05-29 DIAGNOSIS — R1032 Left lower quadrant pain: Secondary | ICD-10-CM | POA: Diagnosis present

## 2018-05-29 DIAGNOSIS — K5792 Diverticulitis of intestine, part unspecified, without perforation or abscess without bleeding: Secondary | ICD-10-CM | POA: Diagnosis not present

## 2018-05-29 DIAGNOSIS — Z79899 Other long term (current) drug therapy: Secondary | ICD-10-CM | POA: Diagnosis not present

## 2018-05-29 LAB — COMPREHENSIVE METABOLIC PANEL
ALBUMIN: 4.4 g/dL (ref 3.5–5.0)
ALT: 21 U/L (ref 0–44)
AST: 32 U/L (ref 15–41)
Alkaline Phosphatase: 51 U/L (ref 38–126)
Anion gap: 10 (ref 5–15)
BUN: 12 mg/dL (ref 6–20)
CALCIUM: 9.4 mg/dL (ref 8.9–10.3)
CHLORIDE: 105 mmol/L (ref 98–111)
CO2: 24 mmol/L (ref 22–32)
Creatinine, Ser: 0.73 mg/dL (ref 0.44–1.00)
GFR calc Af Amer: >60 mL/min (ref >60)
GFR calc non Af Amer: >60 mL/min (ref >60)
Glucose, Bld: 176 mg/dL — ABNORMAL HIGH (ref 70–99)
POTASSIUM: 3.9 mmol/L (ref 3.5–5.1)
Sodium: 139 mmol/L (ref 135–145)
TOTAL PROTEIN: 7.3 g/dL (ref 6.5–8.1)
Total Bilirubin: 0.9 mg/dL (ref 0.3–1.2)

## 2018-05-29 LAB — CBC
HCT: 37 % (ref 35.0–47.0)
HEMOGLOBIN: 13 g/dL (ref 12.0–16.0)
MCH: 29.7 pg (ref 26.0–34.0)
MCHC: 35 g/dL (ref 32.0–36.0)
MCV: 84.7 fL (ref 80.0–100.0)
PLATELETS: 236 10*3/uL (ref 150–440)
RBC: 4.37 MIL/uL (ref 3.80–5.20)
RDW: 13.6 % (ref 11.5–14.5)
WBC: 5.8 10*3/uL (ref 3.6–11.0)

## 2018-05-29 LAB — LIPASE, BLOOD: LIPASE: 44 U/L (ref 11–51)

## 2018-05-29 MED ORDER — CIPROFLOXACIN HCL 500 MG PO TABS: 500.0000 mg | ORAL_TABLET | Freq: Two times a day (BID) | ORAL | 0 refills | Status: AC

## 2018-05-29 MED ORDER — SODIUM CHLORIDE 0.9 % IV BOLUS
1000.0000 mL | Freq: Once | INTRAVENOUS | Status: AC
  Administered 2018-05-29: 1000 mL via INTRAVENOUS

## 2018-05-29 MED ORDER — METRONIDAZOLE IN NACL 5-0.79 MG/ML-% IV SOLN
500.0000 mg | Freq: Once | INTRAVENOUS | Status: AC
  Administered 2018-05-29: 500 mg via INTRAVENOUS
  Filled 2018-05-29: qty 100

## 2018-05-29 MED ORDER — HYDROCODONE-ACETAMINOPHEN 5-325 MG PO TABS: 1.0000 | ORAL_TABLET | Freq: Four times a day (QID) | ORAL | 0 refills | Status: AC | PRN

## 2018-05-29 MED ORDER — METOCLOPRAMIDE HCL 5 MG/ML IJ SOLN
10.0000 mg | Freq: Once | INTRAMUSCULAR | Status: AC
  Administered 2018-05-29: 10 mg via INTRAVENOUS
  Filled 2018-05-29: qty 2

## 2018-05-29 MED ORDER — ONDANSETRON 8 MG PO TBDP: 8.0000 mg | ORAL_TABLET | Freq: Three times a day (TID) | ORAL | 0 refills | Status: AC | PRN

## 2018-05-29 MED ORDER — CIPROFLOXACIN IN D5W 400 MG/200ML IV SOLN
400.0000 mg | Freq: Once | INTRAVENOUS | Status: AC
  Administered 2018-05-29: 400 mg via INTRAVENOUS
  Filled 2018-05-29: qty 200

## 2018-05-29 MED ORDER — MORPHINE SULFATE (PF) 4 MG/ML IV SOLN
4.0000 mg | Freq: Once | INTRAVENOUS | Status: AC
Start: 2018-05-29 — End: 2018-05-29
  Administered 2018-05-29: 4 mg via INTRAVENOUS
  Filled 2018-05-29: qty 1

## 2018-05-29 MED ORDER — ONDANSETRON HCL 4 MG/2ML IJ SOLN
4.0000 mg | Freq: Once | INTRAMUSCULAR | Status: AC
  Administered 2018-05-29: 4 mg via INTRAVENOUS
  Filled 2018-05-29: qty 2

## 2018-05-29 MED ORDER — METRONIDAZOLE 500 MG PO TABS: 500.0000 mg | ORAL_TABLET | Freq: Three times a day (TID) | ORAL | 0 refills | Status: AC

## 2018-05-29 NOTE — ED Triage Notes (Signed)
Pt comes into the ED via POV c/o abdominal pain that started on Friday.  Patient states the pain is on the LLQ and it feels the same as when she had diverticulitis.  Patient states she has N/V/D.  Patient denies any fevers, shortness of breath or dizziness.  Patient in NAD at this time with even and unlabored respirations and is ambulatory to triage.

## 2018-05-29 NOTE — ED Provider Notes (Signed)
[facility] Emergency Department Provider Note ____________________________________________   First MD Initiated Contact with Patient 05/29/18 1531     (approximate)  I have reviewed the triage vital signs and the nursing notes.   HISTORY  Chief Complaint Abdominal Pain    HPI Wendy Rivas is a 52 y.o. female with PMH as noted below as well as a history of diverticulitis who presents with left lower quadrant abdominal pain over the last 2 days, gradual onset, nonradiating, and associated with nausea but no vomiting, as well as nonbloody diarrhea.  The patient denies fever or weakness.  She states that she has had decreased appetite.  The patient states that the pain feels exactly the same as prior diverticulitis, with most recent flare being a few months ago.  She denies urinary symptoms.   Past Medical History:  Diagnosis Date  . Anxiety   . Depression   . Diabetes mellitus without complication (HCC)   . Gout   . Hypertension   . Kidney stones     There are no active problems to display for this patient.   Past Surgical History:  Procedure Laterality Date  . ABDOMINAL HYSTERECTOMY    . CESAREAN SECTION    . CHOLECYSTECTOMY    . ELBOW SURGERY      Prior to Admission medications   Medication Sig Start Date End Date Taking? Authorizing Provider  acetaminophen (TYLENOL) 500 MG tablet Take 500-1,000 mg by mouth every 6 (six) hours as needed for mild pain or moderate pain.    [provider]  allopurinol (ZYLOPRIM) 100 MG tablet Take 1 tablet (100 mg total) by mouth daily. 04/01/18   Emily Filbert, MD  ciprofloxacin (CIPRO) 500 MG tablet Take 1 tablet (500 mg total) by mouth 2 (two) times daily for 10 days. 05/30/18 06/09/18  Dionne Bucy, MD  HYDROcodone-acetaminophen (NORCO/VICODIN) 5-325 MG tablet Take 1 tablet by mouth every 6 (six) hours as needed for up to 3 days for moderate pain or severe pain. 05/29/18 06/01/18  Dionne Bucy, MD  lisinopril (PRINIVIL,ZESTRIL) 2.5 MG tablet Take 1 tablet (2.5 mg total) by mouth daily. 04/01/18   Emily Filbert, MD  metroNIDAZOLE (FLAGYL) 500 MG tablet Take 1 tablet (500 mg total) by mouth 3 (three) times daily for 10 days. 05/30/18 06/09/18  Dionne Bucy, MD  ondansetron (ZOFRAN ODT) 8 MG disintegrating tablet Take 1 tablet (8 mg total) by mouth every 8 (eight) hours as needed for up to 4 days for nausea or vomiting. 05/29/18 06/02/18  Dionne Bucy, MD  oxyCODONE-acetaminophen (PERCOCET) 5-325 MG tablet Take 1 tablet by mouth every 8 (eight) hours as needed. 04/01/18   Emily Filbert, MD  ranitidine (ZANTAC) 150 MG tablet Take 1 tablet (150 mg total) by mouth 2 (two) times daily. 04/01/18   Emily Filbert, MD  sertraline (ZOLOFT) 100 MG tablet Take 1 tablet (100 mg total) by mouth daily. 04/01/18   Emily Filbert, MD  traZODone (DESYREL) 50 MG tablet Take 2 tablets (100 mg total) by mouth at bedtime. 04/01/18   Emily Filbert, MD    Allergies Januvia [sitagliptin]  No family history on file.  Social History Social History   Tobacco Use  . Smoking status: Never Smoker  . Smokeless tobacco: Never Used  Substance Use Topics  . Alcohol use: No    Frequency: Never  . Drug use: No    Review of Systems  Constitutional: No fever. Eyes: No redness. ENT: No  sore throat. Cardiovascular: Denies chest pain. Respiratory: Denies shortness of breath. Gastrointestinal: Positive for nausea. Genitourinary: Negative for dysuria.  Musculoskeletal: Negative for back pain. Skin: Negative for rash. Neurological: Negative for headache.   ____________________________________________   PHYSICAL EXAM:  VITAL SIGNS: ED Triage Vitals  Enc Vitals Group     BP 05/29/18 1440 117/85     Pulse Rate 05/29/18 1440 75     Resp 05/29/18 1440 16     Temp 05/29/18 1440 98.4 F (36.9 C)     Temp Source 05/29/18 1440 Oral     SpO2 05/29/18 1440 92 %      Weight 05/29/18 1438 200 lb (90.7 kg)     Height 05/29/18 1438 5\' 8"  (1.727 m)     Head Circumference --      Peak Flow --      Pain Score 05/29/18 1438 7     Pain Loc --      Pain Edu? --      Excl. in GC? --     Constitutional: Alert and oriented. Well appearing and in no acute distress. Eyes: Conjunctivae are normal.  Head: Atraumatic. Nose: No congestion/rhinnorhea. Mouth/Throat: Mucous membranes are slightly dry.   Neck: Normal range of motion.  Cardiovascular:  Good peripheral circulation. Respiratory: Normal respiratory effort.  No retractions. Gastrointestinal: Soft with very mild left lower quadrant tenderness but no peritoneal signs. No distention.  Genitourinary: No flank tenderness. Musculoskeletal: Extremities warm and well perfused.  Neurologic:  Normal speech and language. No gross focal neurologic deficits are appreciated.  Skin:  Skin is warm and dry. No rash noted. Psychiatric: Mood and affect are normal. Speech and behavior are normal.  ____________________________________________   LABS (all labs ordered are listed, but only abnormal results are displayed)  Labs Reviewed  COMPREHENSIVE METABOLIC PANEL - Abnormal; Notable for the following components:      Result Value   Glucose, Bld 176 (*)    All other components within normal limits  LIPASE, BLOOD  CBC   ____________________________________________  EKG   ____________________________________________  RADIOLOGY    ____________________________________________   PROCEDURES  Procedure(s) performed: No  Procedures  Critical Care performed: No ____________________________________________   INITIAL IMPRESSION / ASSESSMENT AND PLAN / ED COURSE  Pertinent labs & imaging results that were available during my care of the patient were reviewed by me and considered in my medical decision making (see chart for details).  52 year old female with past medical history as noted above  including a recent history of diverticulitis and multiple prior episodes of diverticulitis in the past presents with left lower quadrant abdominal pain with nausea and diarrhea, which she states is identical to her prior episodes of diverticulitis.  I reviewed the past medical records in Epic; the patient was most recently treated and released in the ED in late May with CT showing diverticulitis.  The patient reports that she has had multiple CTs in the past as well.  On exam, the patient is well-appearing, the vital signs are normal, and she has very mild tenderness in the left lower quadrant.  Overall, the presentation is consistent with recurrent diverticulitis.  The patient's initial labs are within normal limits.  Based on shared decision making with the patient, we will not do a repeat CT at this time given that the patient has no elevated WBC count, other concerning lab abnormalities, or exam findings that would suggest complication such as perforation.  I will give fluids, symptomatic treatment for pain and nausea,  and a first dose of IV antibiotics here.  Plan for discharge with primary care follow-up.  Clinical Course as of May 29 2321  Sun May 29, 2018  1530 RBC: 4.37 [SS]    Clinical Course User Index [SS] Dionne Bucy, MD   Return precautions given, and the patient expressed understanding.  She feels well to go home.  ____________________________________________   FINAL CLINICAL IMPRESSION(S) / ED DIAGNOSES  Final diagnoses:  Diverticulitis      NEW MEDICATIONS STARTED DURING THIS VISIT:  Discharge Medication List as of 05/29/2018  6:27 PM    START taking these medications   Details  ciprofloxacin (CIPRO) 500 MG tablet Take 1 tablet (500 mg total) by mouth 2 (two) times daily for 10 days., Starting Mon 05/30/2018, Until Thu 06/09/2018, Print    HYDROcodone-acetaminophen (NORCO/VICODIN) 5-325 MG tablet Take 1 tablet by mouth every 6 (six) hours as needed for up to 3  days for moderate pain or severe pain., Starting Sun 05/29/2018, Until Wed 06/01/2018, Print    ondansetron (ZOFRAN ODT) 8 MG disintegrating tablet Take 1 tablet (8 mg total) by mouth every 8 (eight) hours as needed for up to 4 days for nausea or vomiting., Starting Sun 05/29/2018, Until Thu 06/02/2018, Print         Note:  This document was prepared using Dragon voice recognition software and may include unintentional dictation errors.    Dionne Bucy, MD 05/29/18 2322

## 2018-05-29 NOTE — Discharge Instructions (Addendum)
Return to the ER for new, worsening, persistent severe abdominal pain, fevers, vomiting, weakness, or any other new or worsening symptoms that concern you.  You should return if you cannot tolerate the medications.  Follow-up with your regular doctor.  Take the antibiotics as prescribed and finish the full course. Procedures

## 2018-07-21 ENCOUNTER — Ambulatory Visit: Payer: Medicare HMO | Admitting: Internal Medicine

## 2018-07-21 ENCOUNTER — Encounter

## 2018-07-21 DIAGNOSIS — Z0289 Encounter for other administrative examinations: Secondary | ICD-10-CM

## 2018-08-04 ENCOUNTER — Encounter: Payer: Self-pay | Admitting: Emergency Medicine

## 2018-08-04 ENCOUNTER — Other Ambulatory Visit: Payer: Self-pay

## 2018-08-04 ENCOUNTER — Emergency Department
Admission: EM | Admit: 2018-08-04 | Discharge: 2018-08-04 | Disposition: A | Discharge status: Home / Self Care | Payer: Medicare HMO | Attending: Emergency Medicine | Admitting: Emergency Medicine

## 2018-08-04 DIAGNOSIS — E860 Dehydration: Secondary | ICD-10-CM | POA: Insufficient documentation

## 2018-08-04 DIAGNOSIS — R197 Diarrhea, unspecified: Secondary | ICD-10-CM | POA: Insufficient documentation

## 2018-08-04 DIAGNOSIS — Z79899 Other long term (current) drug therapy: Secondary | ICD-10-CM | POA: Diagnosis not present

## 2018-08-04 DIAGNOSIS — I10 Essential (primary) hypertension: Secondary | ICD-10-CM | POA: Diagnosis not present

## 2018-08-04 DIAGNOSIS — E119 Type 2 diabetes mellitus without complications: Secondary | ICD-10-CM | POA: Insufficient documentation

## 2018-08-04 DIAGNOSIS — Z7984 Long term (current) use of oral hypoglycemic drugs: Secondary | ICD-10-CM | POA: Diagnosis not present

## 2018-08-04 DIAGNOSIS — N3000 Acute cystitis without hematuria: Secondary | ICD-10-CM | POA: Diagnosis not present

## 2018-08-04 DIAGNOSIS — R112 Nausea with vomiting, unspecified: Secondary | ICD-10-CM | POA: Insufficient documentation

## 2018-08-04 LAB — URINALYSIS, COMPLETE (UACMP) WITH MICROSCOPIC
Bacteria, UA: NONE SEEN
Bilirubin Urine: NEGATIVE
Glucose, UA: NEGATIVE mg/dL
Hgb urine dipstick: NEGATIVE
Ketones, ur: NEGATIVE mg/dL
NITRITE: NEGATIVE
Protein, ur: NEGATIVE mg/dL
SPECIFIC GRAVITY, URINE: 1.018 (ref 1.005–1.030)
pH: 5 (ref 5.0–8.0)

## 2018-08-04 LAB — CBC
HCT: 37.4 % (ref 35.0–47.0)
HEMOGLOBIN: 13.3 g/dL (ref 12.0–16.0)
MCH: 30.1 pg (ref 26.0–34.0)
MCHC: 35.6 g/dL (ref 32.0–36.0)
MCV: 84.4 fL (ref 80.0–100.0)
Platelets: 243 10*3/uL (ref 150–440)
RBC: 4.44 MIL/uL (ref 3.80–5.20)
RDW: 13.4 % (ref 11.5–14.5)
WBC: 7.4 10*3/uL (ref 3.6–11.0)

## 2018-08-04 LAB — COMPREHENSIVE METABOLIC PANEL
ALK PHOS: 45 U/L (ref 38–126)
ALT: 26 U/L (ref 0–44)
ANION GAP: 10 (ref 5–15)
AST: 31 U/L (ref 15–41)
Albumin: 4.6 g/dL (ref 3.5–5.0)
BILIRUBIN TOTAL: 0.6 mg/dL (ref 0.3–1.2)
BUN: 18 mg/dL (ref 6–20)
CALCIUM: 9.4 mg/dL (ref 8.9–10.3)
CO2: 24 mmol/L (ref 22–32)
Chloride: 106 mmol/L (ref 98–111)
Creatinine, Ser: 1.18 mg/dL — ABNORMAL HIGH (ref 0.44–1.00)
GFR, EST NON AFRICAN AMERICAN: 53 mL/min — AB (ref >60)
Glucose, Bld: 133 mg/dL — ABNORMAL HIGH (ref 70–99)
Potassium: 3.8 mmol/L (ref 3.5–5.1)
Sodium: 140 mmol/L (ref 135–145)
TOTAL PROTEIN: 7 g/dL (ref 6.5–8.1)

## 2018-08-04 LAB — LIPASE, BLOOD: Lipase: 47 U/L (ref 11–51)

## 2018-08-04 MED ORDER — DICYCLOMINE HCL 10 MG PO CAPS
10.0000 mg | ORAL_CAPSULE | Freq: Once | ORAL | Status: AC
  Administered 2018-08-04: 10 mg via ORAL
  Filled 2018-08-04: qty 1

## 2018-08-04 MED ORDER — SODIUM CHLORIDE 0.9 % IV SOLN
8.0000 mg | Freq: Once | INTRAVENOUS | Status: AC
  Administered 2018-08-04: 8 mg via INTRAVENOUS
  Filled 2018-08-04: qty 4

## 2018-08-04 MED ORDER — CEPHALEXIN 500 MG PO CAPS: 500.0000 mg | ORAL_CAPSULE | Freq: Two times a day (BID) | ORAL | 0 refills | Status: AC

## 2018-08-04 MED ORDER — METOCLOPRAMIDE HCL 10 MG PO TABS
10.0000 mg | ORAL_TABLET | Freq: Once | ORAL | Status: AC
  Administered 2018-08-04: 10 mg via ORAL
  Filled 2018-08-04: qty 1

## 2018-08-04 MED ORDER — SODIUM CHLORIDE 0.9 % IV BOLUS
1000.0000 mL | Freq: Once | INTRAVENOUS | Status: AC
  Administered 2018-08-04: 1000 mL via INTRAVENOUS

## 2018-08-04 MED ORDER — DICYCLOMINE HCL 20 MG PO TABS: 20.0000 mg | ORAL_TABLET | Freq: Three times a day (TID) | ORAL | 0 refills | Status: DC | PRN

## 2018-08-04 MED ORDER — ONDANSETRON 4 MG PO TBDP: 4.0000 mg | ORAL_TABLET | Freq: Three times a day (TID) | ORAL | 0 refills | Status: DC | PRN

## 2018-08-04 MED ORDER — METOCLOPRAMIDE HCL 5 MG PO TABS: 5.0000 mg | ORAL_TABLET | Freq: Three times a day (TID) | ORAL | 0 refills | Status: DC | PRN

## 2018-08-04 MED ORDER — LOPERAMIDE HCL 2 MG PO CAPS
4.0000 mg | ORAL_CAPSULE | Freq: Once | ORAL | Status: AC
  Administered 2018-08-04: 4 mg via ORAL
  Filled 2018-08-04: qty 2

## 2018-08-04 MED ORDER — SODIUM CHLORIDE 0.9 % IV SOLN
1.0000 g | Freq: Once | INTRAVENOUS | Status: AC
  Administered 2018-08-04: 1 g via INTRAVENOUS
  Filled 2018-08-04: qty 10

## 2018-08-04 NOTE — Discharge Instructions (Addendum)
Your exam and labs are essentially normal, and consistent with diarrhea, which may be food-related. Continue to hydrate with low sugar Gatorade/Powerade to replenish your system. Take the antibiotic as directed for the UTI. Eat small, carbohydrate-rich foods to increase energy and reduce diarrhea. Follow-up with your provider or [facility] as needed.

## 2018-08-04 NOTE — ED Triage Notes (Signed)
Pt in via POV, reports nausea and diarrhea since Sunday.  Pt reports generalized weakness as well.  Pt taking zofran and Pepto bismol to alleviate symptoms.  Ambulatory to triage, vitals WDL, NAD noted at this time.

## 2018-08-05 NOTE — ED Provider Notes (Signed)
[facility] Emergency Department Provider Note ____________________________________________  Time seen: 2029  I have reviewed the triage vital signs and the nursing notes.  HISTORY  Chief Complaint  Dehydration and Diarrhea  HPI Wendy Rivas is a 52 y.o. female with a history of diverticulitis, kidney stones, gout, GERD and hypertension, presents to the ED for evaluation of nausea without vomiting and diarrhea since Sunday.  Patient denies any sick contacts or recent travel.  She is concerned that her symptoms may be related to a food exposure.  She admits to eating a 7-layer bean dip, that she got from a local food pantry.  The food was not out of date but close to it when she began to eat it.  It was the third day, she worried that it was spoiled.  Since that time she has had nausea she has managed with Zofran.  She has also taken some Pepto-Bismol tablets in the interim.  She describes watery diarrhea and some intermittent abdominal cramps.  She denies pain is similar to what she has experienced in the past with diverticulitis.  She also gives a remote history of a mild UTI that was treated several weeks earlier; she did complete a course of antibiotics.  She has noted some intermittent bladder spasms.  She denies any heartburn, reflux, or vomiting.  She also denies any chest pain, shortness of breath, or fevers.  Generalized weakness due to inability to keep down any solid foods.  She is only recently been able to drink some Gatorade, and ate some crackers and soup.  Past Medical History:  Diagnosis Date  . Anxiety   . Depression   . Diabetes mellitus without complication (HCC)   . Gout   . Hypertension   . Kidney stones     There are no active problems to display for this patient.   Past Surgical History:  Procedure Laterality Date  . ABDOMINAL HYSTERECTOMY    . CESAREAN SECTION    . CHOLECYSTECTOMY    . ELBOW SURGERY      Prior to Admission medications    Medication Sig Start Date End Date Taking? Authorizing Provider  metFORMIN (GLUCOPHAGE) 1000 MG tablet Take 1,000 mg by mouth 1 day or 1 dose.   Yes [provider]  metFORMIN (GLUCOPHAGE) 500 MG tablet Take 500 mg by mouth daily with breakfast.   Yes [provider]  pantoprazole (PROTONIX) 40 MG tablet Take 40 mg by mouth daily.   Yes [provider]  acetaminophen (TYLENOL) 500 MG tablet Take 500-1,000 mg by mouth every 6 (six) hours as needed for mild pain or moderate pain.    [provider]  allopurinol (ZYLOPRIM) 100 MG tablet Take 1 tablet (100 mg total) by mouth daily. 04/01/18   Emily Filbert, MD  cephALEXin (KEFLEX) 500 MG capsule Take 1 capsule (500 mg total) by mouth 2 (two) times daily for 7 days. 08/04/18 08/11/18  Erik Burkett, Charlesetta Ivory, PA-C  dicyclomine (BENTYL) 20 MG tablet Take 1 tablet (20 mg total) by mouth 3 (three) times daily as needed for up to 10 days for spasms. 08/04/18 08/14/18  Kortni Hasten, Charlesetta Ivory, PA-C  lisinopril (PRINIVIL,ZESTRIL) 2.5 MG tablet Take 1 tablet (2.5 mg total) by mouth daily. 04/01/18   Emily Filbert, MD  ondansetron (ZOFRAN ODT) 4 MG disintegrating tablet Take 1 tablet (4 mg total) by mouth every 8 (eight) hours as needed. 08/04/18   Giovonni Poirier, Charlesetta Ivory, PA-C  oxyCODONE-acetaminophen (PERCOCET) [phone]  MG tablet Take 1 tablet by mouth every 8 (eight) hours as needed. 04/01/18   Emily Filbert, MD  ranitidine (ZANTAC) 150 MG tablet Take 1 tablet (150 mg total) by mouth 2 (two) times daily. 04/01/18   Emily Filbert, MD  sertraline (ZOLOFT) 100 MG tablet Take 1 tablet (100 mg total) by mouth daily. 04/01/18   Emily Filbert, MD  traZODone (DESYREL) 50 MG tablet Take 2 tablets (100 mg total) by mouth at bedtime. 04/01/18   Emily Filbert, MD    Allergies Januvia [sitagliptin]  No family history on file.  Social History Social History   Tobacco Use  . Smoking status: Never  Smoker  . Smokeless tobacco: Never Used  Substance Use Topics  . Alcohol use: No    Frequency: Never  . Drug use: No    Review of Systems  Constitutional: Negative for fever. Eyes: Negative for visual changes. ENT: Negative for sore throat. Cardiovascular: Negative for chest pain. Respiratory: Negative for shortness of breath. Gastrointestinal: Negative for abdominal pain, vomiting. Reports nausea and diarrhea. Genitourinary: Negative for dysuria. Musculoskeletal: Negative for back pain. Skin: Negative for rash. Neurological: Negative for headaches, focal weakness or numbness. ____________________________________________  PHYSICAL EXAM:  VITAL SIGNS: ED Triage Vitals [08/04/18 1801]  Enc Vitals Group     BP 110/78     Pulse Rate 82     Resp 16     Temp 98.8 F (37.1 C)     Temp Source Oral     SpO2 97 %     Weight 195 lb (88.5 kg)     Height 5\' 8"  (1.727 m)     Head Circumference      Peak Flow      Pain Score 0     Pain Loc      Pain Edu?      Excl. in GC?     Constitutional: Alert and oriented. Well appearing and in no distress. Head: Normocephalic and atraumatic. Eyes: Conjunctivae are normal. Normal extraocular movements Cardiovascular: Normal rate, regular rhythm. Normal distal pulses. Respiratory: Normal respiratory effort. No wheezes/rales/rhonchi. Gastrointestinal: Soft and nontender. No distention, rebound, guarding, or rigidity.  Normoactive bowel sounds noted. No CVA tenderness. Musculoskeletal: Nontender with normal range of motion in all extremities.  Neurologic:  Normal gait without ataxia. Normal speech and language. No gross focal neurologic deficits are appreciated. Skin:  Skin is warm, dry and intact. No rash noted. Psychiatric: Mood and affect are normal. Patient exhibits appropriate insight and judgment. ____________________________________________   LABS (pertinent positives/negatives)  Labs Reviewed  COMPREHENSIVE METABOLIC PANEL -  Abnormal; Notable for the following components:      Result Value   Glucose, Bld 133 (*)    Creatinine, Ser 1.18 (*)    GFR calc non Af Amer 53 (*)    All other components within normal limits  URINALYSIS, COMPLETE (UACMP) WITH MICROSCOPIC - Abnormal; Notable for the following components:   Color, Urine YELLOW (*)    APPearance HAZY (*)    Leukocytes, UA LARGE (*)    WBC, UA >50 (*)    All other components within normal limits  URINE CULTURE  LIPASE, BLOOD  CBC  ____________________________________________  PROCEDURES  Procedures NS 1000 ml x 2 Loperamide 4 mg PO Metoclopramide 10 mg PO Ceftriaxone 1 g IVPB Ondansetron 8 mg /NS 50 ml IVPB Dicyclomine 10 mg PO ____________________________________________  INITIAL IMPRESSION / ASSESSMENT AND PLAN / ED COURSE  Differential diagnosis includes, but is not  limited to, ovarian cyst, ovarian torsion, acute appendicitis, diverticulitis, urinary tract infection/pyelonephritis, endometriosis, bowel obstruction, colitis, renal colic, gastroenteritis, hernia, fibroids, endometriosis, pregnancy related pain including ectopic pregnancy, etc.  Patient with ED evaluation of several days of nausea w/o vomiting and diarrhea. Her labs are essentially normal, and consistent with an acute cystitis and mild dehydration. She reports improvement of her symptoms after administration of medicines. She will be discharged with prescriptions for Keflex, Bentyl, and Zofran. She will follow-up with her provider as needed.  ____________________________________________  FINAL CLINICAL IMPRESSION(S) / ED DIAGNOSES  Final diagnoses:  Diarrhea in adult patient  Acute cystitis without hematuria      Shir Bergman, Charlesetta Ivory, PA-C 08/05/18 0045    Don Perking, [place], MD 08/08/18 [phone]

## 2018-08-07 LAB — URINE CULTURE
Culture: 60000 — AB
Special Requests: NORMAL

## 2018-10-30 ENCOUNTER — Other Ambulatory Visit: Payer: Self-pay

## 2018-10-30 ENCOUNTER — Emergency Department
Admission: EM | Admit: 2018-10-30 | Discharge: 2018-10-30 | Disposition: A | Discharge status: Home / Self Care | Payer: Medicare HMO | Attending: Emergency Medicine | Admitting: Emergency Medicine

## 2018-10-30 ENCOUNTER — Encounter: Payer: Self-pay | Admitting: Emergency Medicine

## 2018-10-30 DIAGNOSIS — Z7984 Long term (current) use of oral hypoglycemic drugs: Secondary | ICD-10-CM | POA: Diagnosis not present

## 2018-10-30 DIAGNOSIS — E119 Type 2 diabetes mellitus without complications: Secondary | ICD-10-CM | POA: Insufficient documentation

## 2018-10-30 DIAGNOSIS — J069 Acute upper respiratory infection, unspecified: Secondary | ICD-10-CM | POA: Diagnosis not present

## 2018-10-30 DIAGNOSIS — I10 Essential (primary) hypertension: Secondary | ICD-10-CM | POA: Insufficient documentation

## 2018-10-30 DIAGNOSIS — Z79899 Other long term (current) drug therapy: Secondary | ICD-10-CM | POA: Insufficient documentation

## 2018-10-30 DIAGNOSIS — R070 Pain in throat: Secondary | ICD-10-CM | POA: Diagnosis present

## 2018-10-30 LAB — INFLUENZA PANEL BY PCR (TYPE A & B)
INFLBPCR: NEGATIVE
Influenza A By PCR: NEGATIVE

## 2018-10-30 LAB — GROUP A STREP BY PCR: GROUP A STREP BY PCR: NOT DETECTED

## 2018-10-30 MED ORDER — FLUTICASONE PROPIONATE 50 MCG/ACT NA SUSP: 2.0000 | Freq: Every day | NASAL | 2 refills | Status: DC

## 2018-10-30 MED ORDER — AMOXICILLIN 500 MG PO CAPS: 500.0000 mg | ORAL_CAPSULE | Freq: Three times a day (TID) | ORAL | 0 refills | Status: DC

## 2018-10-30 MED ORDER — FLUCONAZOLE 150 MG PO TABS: ORAL_TABLET | ORAL | 0 refills | Status: DC

## 2018-10-30 NOTE — ED Triage Notes (Signed)
Pt to ED c/o sore throat, Rt ear pain, sinus pressure x 1 day. Pt is in NAD at this time.

## 2018-10-30 NOTE — ED Provider Notes (Signed)
[facility] Emergency Department Provider Note  ____________________________________________   First MD Initiated Contact with Patient 10/30/18 1311     (approximate)  I have reviewed the triage vital signs and the nursing notes.   HISTORY  Chief Complaint Sore Throat    HPI Wendy Rivas is a 52 y.o. female presents emergency department complaining of sore throat, ear pain, and sinus pressure for 1 day.  She denies any fever or chills but did feel hot.  She states mucus is been slightly clear to yellow when she coughs it up.  She is concerned as she has an appointment at employee health and wellness to have her Tdap and flu shot tomorrow.  She is does not know whether she can attend that appointment or not.    Past Medical History:  Diagnosis Date  . Anxiety   . Depression   . Diabetes mellitus without complication (HCC)   . Gout   . Hypertension   . Kidney stones     There are no active problems to display for this patient.   Past Surgical History:  Procedure Laterality Date  . ABDOMINAL HYSTERECTOMY    . CESAREAN SECTION    . CHOLECYSTECTOMY    . ELBOW SURGERY      Prior to Admission medications   Medication Sig Start Date End Date Taking? Authorizing Provider  acetaminophen (TYLENOL) 500 MG tablet Take 500-1,000 mg by mouth every 6 (six) hours as needed for mild pain or moderate pain.    [provider]  allopurinol (ZYLOPRIM) 100 MG tablet Take 1 tablet (100 mg total) by mouth daily. 04/01/18   Emily FilbertWilliams, Jonathan E, MD  amoxicillin (AMOXIL) 500 MG capsule Take 1 capsule (500 mg total) by mouth 3 (three) times daily. 10/30/18   Fisher, Roselyn BeringSusan W, PA-C  dicyclomine (BENTYL) 20 MG tablet Take 1 tablet (20 mg total) by mouth 3 (three) times daily as needed for up to 10 days for spasms. 08/04/18 08/14/18  Menshew, Charlesetta IvoryJenise V Bacon, PA-C  fluconazole (DIFLUCAN) 150 MG tablet Take one now and one in a week 10/30/18   Sherrie MustacheFisher, Roselyn BeringSusan W, PA-C    fluticasone [facility](FLONASE) 50 MCG/ACT nasal spray Place 2 sprays into both nostrils daily. 10/30/18   Fisher, Roselyn BeringSusan W, PA-C  lisinopril (PRINIVIL,ZESTRIL) 2.5 MG tablet Take 1 tablet (2.5 mg total) by mouth daily. 04/01/18   Emily FilbertWilliams, Jonathan E, MD  metFORMIN (GLUCOPHAGE) 1000 MG tablet Take 1,000 mg by mouth 1 day or 1 dose.    [provider]  metFORMIN (GLUCOPHAGE) 500 MG tablet Take 500 mg by mouth daily with breakfast.    [provider]  oxyCODONE-acetaminophen (PERCOCET) 5-325 MG tablet Take 1 tablet by mouth every 8 (eight) hours as needed. 04/01/18   Emily FilbertWilliams, Jonathan E, MD  pantoprazole (PROTONIX) 40 MG tablet Take 40 mg by mouth daily.    [provider]  sertraline (ZOLOFT) 100 MG tablet Take 1 tablet (100 mg total) by mouth daily. 04/01/18   Emily FilbertWilliams, Jonathan E, MD  traZODone (DESYREL) 50 MG tablet Take 2 tablets (100 mg total) by mouth at bedtime. 04/01/18   Emily FilbertWilliams, Jonathan E, MD    Allergies Januvia [sitagliptin]  No family history on file.  Social History Social History   Tobacco Use  . Smoking status: Never Smoker  . Smokeless tobacco: Never Used  Substance Use Topics  . Alcohol use: No    Frequency: Never  . Drug use: No    Review of Systems  Constitutional: Unsure fever/chills Eyes: No visual changes. ENT: Positive sore throat.  Positive for ear pain Respiratory: Positive cough Genitourinary: Negative for dysuria. Musculoskeletal: Negative for back pain. Skin: Negative for rash.    ____________________________________________   PHYSICAL EXAM:  VITAL SIGNS: ED Triage Vitals  Enc Vitals Group     BP 10/30/18 1233 130/80     Pulse Rate 10/30/18 1233 96     Resp 10/30/18 1233 12     Temp 10/30/18 1233 97.8 F (36.6 C)     Temp Source 10/30/18 1233 Oral     SpO2 10/30/18 1233 94 %     Weight --      Height --      Head Circumference --      Peak Flow --      Pain Score 10/30/18 1230 6     Pain Loc --      Pain Edu? --       Excl. in GC? --     Constitutional: Alert and oriented. Well appearing and in no acute distress. Eyes: Conjunctivae are normal.  Head: Atraumatic. Nose: No congestion/rhinnorhea. Mouth/Throat: Mucous membranes are moist.  Throat is mildly irritated and red Neck:  supple no lymphadenopathy noted Cardiovascular: Normal rate, regular rhythm. Heart sounds are normal Respiratory: Normal respiratory effort.  No retractions, lungs c t a  GU: deferred Musculoskeletal: FROM all extremities, warm and well perfused Neurologic:  Normal speech and language.  Skin:  Skin is warm, dry and intact. No rash noted. Psychiatric: Mood and affect are normal. Speech and behavior are normal.  ____________________________________________   LABS (all labs ordered are listed, but only abnormal results are displayed)  Labs Reviewed  GROUP A STREP BY PCR  INFLUENZA PANEL BY PCR (TYPE A & B)   ____________________________________________   ____________________________________________  RADIOLOGY    ____________________________________________   PROCEDURES  Procedure(s) performed: No  Procedures    ____________________________________________   INITIAL IMPRESSION / ASSESSMENT AND PLAN / ED COURSE  Pertinent labs & imaging results that were available during my care of the patient were reviewed by me and considered in my medical decision making (see chart for details).   Patient is a 52 year old female presents emergency department flulike symptoms.  Physical exam shows a red throat.  Afebrile patient.  Renal exam is unremarkable  Strep test is negative, flu test is negative  Plain findings to the patient.  Given prescription for amoxicillin, Diflucan, and Flonase.  She is to follow-up with her regular doctor if not better in 3 days.  Return emergency department worsening.  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  They can decide at that time whether they want to give her the flu vaccine or not.  She states she understands will comply.  She was discharged stable condition.     As part of my medical decision making, I reviewed the following data within the electronic MEDICAL RECORD NUMBER History obtained from family, Nursing notes reviewed and incorporated, Labs reviewed flu and strep are negative, Old chart reviewed, Notes from prior ED visits and [place] Controlled Substance Database  ____________________________________________   FINAL CLINICAL IMPRESSION(S) / ED DIAGNOSES  Final diagnoses:  Acute URI      NEW MEDICATIONS STARTED DURING THIS VISIT:  New Prescriptions   AMOXICILLIN (AMOXIL) 500 MG CAPSULE    Take 1 capsule (500 mg total) by mouth 3 (three) times daily.   FLUCONAZOLE (  DIFLUCAN) 150 MG TABLET    Take one now and one in a week   FLUTICASONE (FLONASE) 50 MCG/ACT NASAL SPRAY    Place 2 sprays into both nostrils daily.     Note:  This document was prepared using Dragon voice recognition software and may include unintentional dictation errors.    Faythe GheeFisher, Susan W, PA-C 10/30/18 1636    Schaevitz, Myra Rudeavid Matthew, MD 10/31/18 [phone]

## 2018-10-30 NOTE — Discharge Instructions (Addendum)
Follow-up with your regular doctor or the acute care if you are not better in 3 to 5 days.  Return to the emergency department if worsening.

## 2018-11-11 ENCOUNTER — Ambulatory Visit (INDEPENDENT_AMBULATORY_CARE_PROVIDER_SITE_OTHER): Payer: Medicare HMO | Admitting: Family Medicine

## 2018-11-11 ENCOUNTER — Encounter: Payer: Self-pay | Admitting: Family Medicine

## 2018-11-11 ENCOUNTER — Other Ambulatory Visit: Payer: Self-pay

## 2018-11-11 VITALS — BP 118/80 | HR 85 | Temp 98.2°F | Ht 68.5 in | Wt 199.0 lb

## 2018-11-11 DIAGNOSIS — R110 Nausea: Secondary | ICD-10-CM

## 2018-11-11 DIAGNOSIS — Z7689 Persons encountering health services in other specified circumstances: Secondary | ICD-10-CM | POA: Diagnosis not present

## 2018-11-11 DIAGNOSIS — E1159 Type 2 diabetes mellitus with other circulatory complications: Secondary | ICD-10-CM

## 2018-11-11 DIAGNOSIS — J3089 Other allergic rhinitis: Secondary | ICD-10-CM | POA: Diagnosis not present

## 2018-11-11 DIAGNOSIS — R300 Dysuria: Secondary | ICD-10-CM

## 2018-11-11 DIAGNOSIS — J309 Allergic rhinitis, unspecified: Secondary | ICD-10-CM | POA: Insufficient documentation

## 2018-11-11 DIAGNOSIS — M1A0790 Idiopathic chronic gout, unspecified ankle and foot, without tophus (tophi): Secondary | ICD-10-CM

## 2018-11-11 DIAGNOSIS — I10 Essential (primary) hypertension: Secondary | ICD-10-CM

## 2018-11-11 DIAGNOSIS — E119 Type 2 diabetes mellitus without complications: Secondary | ICD-10-CM

## 2018-11-11 DIAGNOSIS — G4700 Insomnia, unspecified: Secondary | ICD-10-CM | POA: Diagnosis not present

## 2018-11-11 LAB — UA/M W/RFLX CULTURE, ROUTINE
BILIRUBIN UA: NEGATIVE
Glucose, UA: NEGATIVE
Ketones, UA: NEGATIVE
LEUKOCYTES UA: NEGATIVE
Nitrite, UA: NEGATIVE
PH UA: 5.5 (ref 5.0–7.5)
RBC UA: NEGATIVE
Specific Gravity, UA: >1.03 — ABNORMAL HIGH (ref 1.005–1.030)
Urobilinogen, Ur: 0.2 mg/dL (ref 0.2–1.0)

## 2018-11-11 LAB — MICROSCOPIC EXAMINATION
Bacteria, UA: NONE SEEN
RBC, UA: NONE SEEN /hpf (ref 0–2)

## 2018-11-11 MED ORDER — PANTOPRAZOLE SODIUM 40 MG PO TBEC: 40.0000 mg | DELAYED_RELEASE_TABLET | Freq: Every day | ORAL | 1 refills | Status: DC

## 2018-11-11 MED ORDER — METFORMIN HCL 1000 MG PO TABS: 1000.0000 mg | ORAL_TABLET | Freq: Two times a day (BID) | ORAL | 3 refills | Status: AC

## 2018-11-11 MED ORDER — SUCRALFATE 1 G PO TABS: 1.0000 g | ORAL_TABLET | Freq: Three times a day (TID) | ORAL | 1 refills | Status: DC

## 2018-11-11 NOTE — Progress Notes (Signed)
BP 118/80   Pulse 85   Temp 98.2 F (36.8 C) (Oral)   Ht 5' 8.5" (1.74 m)   Wt 199 lb (90.3 kg)   SpO2 95%   BMI 29.82 kg/m    Subjective:    Patient ID: Wendy Rivas, female    DOB: 1966/08/17, 53 y.o.   MRN: [id number]  HPI: Wendy Rivas is a 53 y.o. female  Chief Complaint  Patient presents with  . Establish Care    pt would like to discuus burning urination since today and zantac medication  . Medication Refill   Here today to establish care.   1 day of dysuria. Denies abdominal pain, fevers, flank pain. Long hx of recurrent UTIs. Last UTI was early summer.   Has not had her diabetes checked in over a year. Feels like her sugars have been high and having neuropathy in her feet. Currently on 500 mg QAM and 1000 mg QPM. Has diarrhea with the medicine though. Was told last year she would need to start insulin soon but never went back for follow up. Had been on glipizide in the past well.   Does have a long hx of chronic diarrhea that seems to be dependent on diet. Has had several colonoscopies and no cause has been identified.   Currently dealing with a sinus infection for which she was given amoxil from the ER, not doing much better on that. Just on flonase regularly for her allergies. Deals will issues frequently from that.   Has zofran 4 mg which does not help much for her chronic nausea. Was previously on zantac for years which used to help but was told not to take it. Does have hx of GERD, tried protonix which did help. Denies vomiting, abdominal pain, fevers, hx of gastric ulcers.   Insomnia - lifelong. Has had sleep study in the past but states it was inconclusive. Currently on trazodone which used to help but now not helping much.   Hx of gout under good control of allopurinol. No recent issues.   Relevant past medical, surgical, family and social history reviewed and updated as indicated. Interim medical history since our last visit reviewed. Allergies and  medications reviewed and updated.  Review of Systems  Per HPI unless specifically indicated above     Objective:    BP 118/80   Pulse 85   Temp 98.2 F (36.8 C) (Oral)   Ht 5' 8.5" (1.74 m)   Wt 199 lb (90.3 kg)   SpO2 95%   BMI 29.82 kg/m   Wt Readings from Last 3 Encounters:  11/11/18 199 lb (90.3 kg)  08/04/18 195 lb (88.5 kg)  05/29/18 200 lb (90.7 kg)    Physical Exam Vitals signs and nursing note reviewed.  Constitutional:      Appearance: Normal appearance. She is not ill-appearing.  HENT:     Head: Atraumatic.  Eyes:     Extraocular Movements: Extraocular movements intact.     Conjunctiva/sclera: Conjunctivae normal.  Neck:     Musculoskeletal: Normal range of motion and neck supple.  Cardiovascular:     Rate and Rhythm: Normal rate and regular rhythm.     Heart sounds: Normal heart sounds.  Pulmonary:     Effort: Pulmonary effort is normal.     Breath sounds: Normal breath sounds.  Musculoskeletal: Normal range of motion.  Skin:    General: Skin is warm and dry.  Neurological:     Mental Status: She is alert and  oriented to person, place, and time.  Psychiatric:        Mood and Affect: Mood normal.        Thought Content: Thought content normal.        Judgment: Judgment normal.     Results for orders placed or performed in visit on 11/11/18  Microscopic Examination  Result Value Ref Range   WBC, UA 0-5 0 - 5 /hpf   RBC, UA None seen 0 - 2 /hpf   Epithelial Cells (non renal) 0-10 0 - 10 /hpf   Renal Epithel, UA 0-10 (A) None seen /hpf   Casts Present (A) None seen /lpf   Cast Type Granular casts (A) N/A   Mucus, UA Present Not Estab.   Bacteria, UA None seen None seen/Few  UA/M w/rflx Culture, Routine  Result Value Ref Range   Specific Gravity, UA >1.030 (H) 1.005 - 1.030   pH, UA 5.5 5.0 - 7.5   Color, UA Yellow Yellow   Appearance Ur Clear Clear   Leukocytes, UA Negative Negative   Protein, UA 2+ (A) Negative/Trace   Glucose, UA  Negative Negative   Ketones, UA Negative Negative   RBC, UA Negative Negative   Bilirubin, UA Negative Negative   Urobilinogen, Ur 0.2 0.2 - 1.0 mg/dL   Nitrite, UA Negative Negative   Microscopic Examination See below:       Assessment & Plan:   Problem List Items Addressed This Visit      Cardiovascular and Mediastinum   Hypertension associated with diabetes (HCC)    Stable and WNL, continue current regimen      Relevant Medications   metFORMIN (GLUCOPHAGE) 1000 MG tablet     Respiratory   Allergic rhinitis    Suspect her lingering sinus issues are allergy related. Start good allergy regimen and monitor for improvement. Complete abx        Endocrine   Diabetes mellitus without complication (HCC)    Will check A1C at upcoming CPE and adjust as needed. Lifestyle modifications reviewed      Relevant Medications   metFORMIN (GLUCOPHAGE) 1000 MG tablet     Other   Insomnia    Increase trazodone and continue to monitor. Sleep hygiene reviewed      Gout    Stable, continue allopurinol. WIll recheck uric acid at upcoming CPE       Other Visit Diagnoses    Dysuria    -  Primary   U/A WNL. Push fluids, probiotics. F/u if worsening   Relevant Orders   UA/M w/rflx Culture, Routine (Completed)   Encounter to establish care       Nausea       Tx with carafate and protonix. F/u if not improving. Bland foods, increase as tolerated       Follow up plan: Return for CPE.

## 2018-11-16 DIAGNOSIS — E1159 Type 2 diabetes mellitus with other circulatory complications: Secondary | ICD-10-CM | POA: Insufficient documentation

## 2018-11-16 DIAGNOSIS — E119 Type 2 diabetes mellitus without complications: Secondary | ICD-10-CM | POA: Insufficient documentation

## 2018-11-16 DIAGNOSIS — I10 Essential (primary) hypertension: Secondary | ICD-10-CM

## 2018-11-16 DIAGNOSIS — I152 Hypertension secondary to endocrine disorders: Secondary | ICD-10-CM | POA: Insufficient documentation

## 2018-11-16 DIAGNOSIS — G4700 Insomnia, unspecified: Secondary | ICD-10-CM | POA: Insufficient documentation

## 2018-11-16 DIAGNOSIS — M109 Gout, unspecified: Secondary | ICD-10-CM | POA: Insufficient documentation

## 2018-11-16 NOTE — Assessment & Plan Note (Signed)
Increase trazodone and continue to monitor. Sleep hygiene reviewed

## 2018-11-16 NOTE — Assessment & Plan Note (Signed)
Stable, continue allopurinol. WIll recheck uric acid at upcoming CPE

## 2018-11-16 NOTE — Assessment & Plan Note (Signed)
Stable and WNL, continue current regimen 

## 2018-11-16 NOTE — Assessment & Plan Note (Signed)
Will check A1C at upcoming CPE and adjust as needed. Lifestyle modifications reviewed

## 2018-11-16 NOTE — Assessment & Plan Note (Signed)
Suspect her lingering sinus issues are allergy related. Start good allergy regimen and monitor for improvement. Complete abx

## 2018-11-23 ENCOUNTER — Encounter: Payer: Self-pay | Admitting: Family Medicine

## 2018-11-23 ENCOUNTER — Ambulatory Visit (INDEPENDENT_AMBULATORY_CARE_PROVIDER_SITE_OTHER): Payer: Medicare HMO | Admitting: Family Medicine

## 2018-11-23 VITALS — BP 124/82 | HR 77 | Temp 97.8°F | Ht 68.5 in | Wt 201.0 lb

## 2018-11-23 DIAGNOSIS — I10 Essential (primary) hypertension: Secondary | ICD-10-CM

## 2018-11-23 DIAGNOSIS — G4700 Insomnia, unspecified: Secondary | ICD-10-CM

## 2018-11-23 DIAGNOSIS — M2540 Effusion, unspecified joint: Secondary | ICD-10-CM

## 2018-11-23 DIAGNOSIS — Z6830 Body mass index (BMI) 30.0-30.9, adult: Secondary | ICD-10-CM | POA: Diagnosis not present

## 2018-11-23 DIAGNOSIS — J0101 Acute recurrent maxillary sinusitis: Secondary | ICD-10-CM

## 2018-11-23 DIAGNOSIS — Z1239 Encounter for other screening for malignant neoplasm of breast: Secondary | ICD-10-CM

## 2018-11-23 DIAGNOSIS — E1159 Type 2 diabetes mellitus with other circulatory complications: Secondary | ICD-10-CM | POA: Diagnosis not present

## 2018-11-23 DIAGNOSIS — Z0000 Encounter for general adult medical examination without abnormal findings: Secondary | ICD-10-CM

## 2018-11-23 DIAGNOSIS — E559 Vitamin D deficiency, unspecified: Secondary | ICD-10-CM

## 2018-11-23 DIAGNOSIS — E119 Type 2 diabetes mellitus without complications: Secondary | ICD-10-CM

## 2018-11-23 DIAGNOSIS — E669 Obesity, unspecified: Secondary | ICD-10-CM | POA: Insufficient documentation

## 2018-11-23 DIAGNOSIS — E6609 Other obesity due to excess calories: Secondary | ICD-10-CM | POA: Diagnosis not present

## 2018-11-23 DIAGNOSIS — Z114 Encounter for screening for human immunodeficiency virus [HIV]: Secondary | ICD-10-CM | POA: Diagnosis not present

## 2018-11-23 DIAGNOSIS — M1A0790 Idiopathic chronic gout, unspecified ankle and foot, without tophus (tophi): Secondary | ICD-10-CM

## 2018-11-23 DIAGNOSIS — F339 Major depressive disorder, recurrent, unspecified: Secondary | ICD-10-CM

## 2018-11-23 LAB — UA/M W/RFLX CULTURE, ROUTINE
Bilirubin, UA: NEGATIVE
Glucose, UA: NEGATIVE
Ketones, UA: NEGATIVE
NITRITE UA: NEGATIVE
PH UA: 5.5 (ref 5.0–7.5)
RBC UA: NEGATIVE
Specific Gravity, UA: 1.02 (ref 1.005–1.030)
Urobilinogen, Ur: 0.2 mg/dL (ref 0.2–1.0)

## 2018-11-23 LAB — MICROSCOPIC EXAMINATION

## 2018-11-23 MED ORDER — MONTELUKAST SODIUM 10 MG PO TABS: 10.0000 mg | ORAL_TABLET | Freq: Every day | ORAL | 6 refills | Status: DC

## 2018-11-23 NOTE — Patient Instructions (Addendum)
Please call [place]  @ [phone] to schedule your mammogram.   [facility] - [phone]

## 2018-11-23 NOTE — Progress Notes (Signed)
BP 124/82   Pulse 77   Temp 97.8 F (36.6 C) (Oral)   Ht 5' 8.5" (1.74 m)   Wt 201 lb (91.2 kg)   SpO2 99%   BMI 30.11 kg/m    Subjective:    Patient ID: Wendy Rivas, female    DOB: 01-22-1966, 53 y.o.   MRN: [id number]  HPI: Wendy Rivas is a 53 y.o. female presenting on 11/23/2018 for comprehensive medical examination. Current medical complaints include:see below  Taking her medications faithfully without side effects.   BPs WNL when checked.   Due for A1C, currently just taking metformin and trying to eat a good diet. Does not exercise.   Has increased her allergy regimen which helped initially but sinus sxs and ear pain and coughing returned 3 days ago. Had completed a course of amoxil a week or two ago which temporarily help. Hx of sinus surgery 10 years ago which helped for years but now the past few years is coming back. Wanting referral back to ENT.   Hands and ankles swelling off and on. Does have some stiffness in her joints that seems worse in the morning. Does not recall if she's ever had any testing done to r/o autoimmune causes. Denies fevers, redness, injury. Taking OTC pain relievers without relief.   Hx of high cholesterol, was given a statin in the past but never took it and has not been on anything since.   On zoloft for depression with fairly good relief. Denies SI/HI. Taking trazodone for sleep, states this does help.   She currently lives with: Menopausal Symptoms: no  Depression Screen done today and results listed below:  Depression screen [facility] 2/9 11/23/2018 11/11/2018  Decreased Interest 1 1  Down, Depressed, Hopeless 3 3  PHQ - 2 Score 4 4  Altered sleeping 3 3  Tired, decreased energy 3 3  Change in appetite 3 3  Feeling bad or failure about yourself  3 3  Trouble concentrating 3 3  Moving slowly or fidgety/restless 0 0  Suicidal thoughts 2 2  PHQ-9 Score 21 21    The patient does not have a history of falls. I did complete a risk assessment  for falls. A plan of care for falls was documented.   Past Medical History:  Past Medical History:  Diagnosis Date  . Anxiety   . Depression   . Diabetes mellitus without complication ([place])   . Diverticulitis   . Gout   . Hypertension   . Kidney stones     Surgical History:  Past Surgical History:  Procedure Laterality Date  . ABDOMINAL HYSTERECTOMY    . CESAREAN SECTION    . CHOLECYSTECTOMY    . ELBOW SURGERY    . kidney sugery    . NASAL SINUS SURGERY      Medications:  Current Outpatient Medications on File Prior to Visit  Medication Sig  . acetaminophen (TYLENOL) 500 MG tablet Take 500-1,000 mg by mouth every 6 (six) hours as needed for mild pain or moderate pain.  Marland Kitchen allopurinol (ZYLOPRIM) 100 MG tablet Take 1 tablet (100 mg total) by mouth daily.  . fluticasone (FLONASE) 50 MCG/ACT nasal spray Place 2 sprays into both nostrils daily.  Marland Kitchen lisinopril (PRINIVIL,ZESTRIL) 2.5 MG tablet Take 1 tablet (2.5 mg total) by mouth daily.  . metFORMIN (GLUCOPHAGE) 1000 MG tablet Take 1 tablet (1,000 mg total) by mouth 2 (two) times daily with a meal.  . pantoprazole (PROTONIX) 40 MG tablet Take 1 tablet (  40 mg total) by mouth daily.  . pseudoephedrine (SUDAFED) 30 MG tablet Take 30 mg by mouth every 4 (four) hours as needed for congestion.  . sertraline (ZOLOFT) 100 MG tablet Take 1 tablet (100 mg total) by mouth daily.  . sucralfate (CARAFATE) 1 g tablet Take 1 tablet (1 g total) by mouth 4 (four) times daily -  with meals and at bedtime.  . traZODone (DESYREL) 50 MG tablet Take 2 tablets (100 mg total) by mouth at bedtime.   No current facility-administered medications on file prior to visit.     Allergies:  Allergies  Allergen Reactions  . Januvia [Sitagliptin] Other (See Comments)    Face tingling    Social History:  Social History   Socioeconomic History  . Marital status: Legally Separated    Spouse name: Not on file  . Number of children: Not on file  . Years of  education: Not on file  . Highest education level: Not on file  Occupational History  . Not on file  Social Needs  . Financial resource strain: Not on file  . Food insecurity:    Worry: Not on file    Inability: Not on file  . Transportation needs:    Medical: Not on file    Non-medical: Not on file  Tobacco Use  . Smoking status: Never Smoker  . Smokeless tobacco: Never Used  Substance and Sexual Activity  . Alcohol use: No    Frequency: Never  . Drug use: No  . Sexual activity: Yes    Birth control/protection: Surgical  Lifestyle  . Physical activity:    Days per week: Not on file    Minutes per session: Not on file  . Stress: Not on file  Relationships  . Social connections:    Talks on phone: Not on file    Gets together: Not on file    Attends religious service: Not on file    Active member of club or organization: Not on file    Attends meetings of clubs or organizations: Not on file    Relationship status: Not on file  . Intimate partner violence:    Fear of current or ex partner: Not on file    Emotionally abused: Not on file    Physically abused: Not on file    Forced sexual activity: Not on file  Other Topics Concern  . Not on file  Social History Narrative  . Not on file   Social History   Tobacco Use  Smoking Status Never Smoker  Smokeless Tobacco Never Used   Social History   Substance and Sexual Activity  Alcohol Use No  . Frequency: Never    Family History:  Family History  Problem Relation Age of Onset  . Diabetes Mother   . Hypertension Mother   . Heart disease Mother   . Depression Mother   . Heart disease Father   . Hypertension Father   . Kidney disease Father   . Heart disease Sister   . Hypertension Sister   . Diabetes Brother   . Heart disease Brother   . Hypertension Brother   . Diabetes Maternal Grandmother   . Alzheimer's disease Paternal Grandfather     Past medical history, surgical history, medications,  allergies, family history and social history reviewed with patient today and changes made to appropriate areas of the chart.   Review of Systems - General ROS: negative Psychological ROS: negative Ophthalmic ROS: negative ENT ROS: positive for -  nasal congestion and sinus pain Allergy and Immunology ROS: positive for - seasonal allergies Hematological and Lymphatic ROS: negative Endocrine ROS: negative Breast ROS: negative for breast lumps Respiratory ROS: no cough, shortness of breath, or wheezing Cardiovascular ROS: no chest pain or dyspnea on exertion Gastrointestinal ROS: no abdominal pain, change in bowel habits, or black or bloody stools Genito-Urinary ROS: no dysuria, trouble voiding, or hematuria Musculoskeletal ROS: positive for - joint pain, joint stiffness and joint swelling Neurological ROS: no TIA or stroke symptoms Dermatological ROS: negative All other ROS negative except what is listed above and in the HPI.      Objective:    BP 124/82   Pulse 77   Temp 97.8 F (36.6 C) (Oral)   Ht 5' 8.5" (1.74 m)   Wt 201 lb (91.2 kg)   SpO2 99%   BMI 30.11 kg/m   Wt Readings from Last 3 Encounters:  11/23/18 201 lb (91.2 kg)  11/11/18 199 lb (90.3 kg)  08/04/18 195 lb (88.5 kg)    Physical Exam Vitals signs and nursing note reviewed.  Constitutional:      General: She is not in acute distress.    Appearance: She is well-developed.  HENT:     Head: Atraumatic.     Right Ear: External ear normal.     Left Ear: External ear normal.     Nose: Nose normal.     Mouth/Throat:     Pharynx: No oropharyngeal exudate.  Eyes:     General: No scleral icterus.    Conjunctiva/sclera: Conjunctivae normal.     Pupils: Pupils are equal, round, and reactive to light.  Neck:     Musculoskeletal: Normal range of motion and neck supple.     Thyroid: No thyromegaly.  Cardiovascular:     Rate and Rhythm: Normal rate and regular rhythm.     Heart sounds: Normal heart sounds.    Pulmonary:     Effort: Pulmonary effort is normal. No respiratory distress.     Breath sounds: Normal breath sounds.  Chest:     Breasts:        Right: No mass, skin change or tenderness.        Left: No mass, skin change or tenderness.  Abdominal:     General: Bowel sounds are normal.     Palpations: Abdomen is soft. There is no mass.     Tenderness: There is no abdominal tenderness.  Musculoskeletal: Normal range of motion.        General: No tenderness.  Lymphadenopathy:     Cervical: No cervical adenopathy.  Skin:    General: Skin is warm and dry.     Findings: No rash.  Neurological:     Mental Status: She is alert and oriented to person, place, and time.     Cranial Nerves: No cranial nerve deficit.  Psychiatric:        Behavior: Behavior normal.     Results for orders placed or performed in visit on 11/23/18  Microscopic Examination  Result Value Ref Range   WBC, UA 0-5 0 - 5 /hpf   RBC, UA 0-2 0 - 2 /hpf   Epithelial Cells (non renal) 0-10 0 - 10 /hpf   Bacteria, UA Few (A) None seen/Few  CBC with Differential/Platelet  Result Value Ref Range   WBC 6.2 3.4 - 10.8 x10E3/uL   RBC 4.24 3.77 - 5.28 x10E6/uL   Hemoglobin 12.2 11.1 - 15.9 g/dL   Hematocrit 36.0 34.0 -  46.6 %   MCV 85 79 - 97 fL   MCH 28.8 26.6 - 33.0 pg   MCHC 33.9 31.5 - 35.7 g/dL   RDW 13.6 11.7 - 15.4 %   Platelets 256 150 - 450 x10E3/uL   Neutrophils 65 Not Estab. %   Lymphs 25 Not Estab. %   Monocytes 5 Not Estab. %   Eos 3 Not Estab. %   Basos 1 Not Estab. %   Neutrophils Absolute 4.0 1.4 - 7.0 x10E3/uL   Lymphocytes Absolute 1.6 0.7 - 3.1 x10E3/uL   Monocytes Absolute 0.3 0.1 - 0.9 x10E3/uL   EOS (ABSOLUTE) 0.2 0.0 - 0.4 x10E3/uL   Basophils Absolute 0.1 0.0 - 0.2 x10E3/uL   Immature Granulocytes 1 Not Estab. %   Immature Grans (Abs) 0.0 0.0 - 0.1 x10E3/uL  Comprehensive metabolic panel  Result Value Ref Range   Glucose 133 (H) 65 - 99 mg/dL   BUN 17 6 - 24 mg/dL   Creatinine,  Ser 0.90 0.57 - 1.00 mg/dL   GFR calc non Af Amer 75 >59 mL/min/1.73   GFR calc Af Amer 86 >59 mL/min/1.73   BUN/Creatinine Ratio 19 9 - 23   Sodium 145 (H) 134 - 144 mmol/L   Potassium 4.2 3.5 - 5.2 mmol/L   Chloride 105 96 - 106 mmol/L   CO2 20 20 - 29 mmol/L   Calcium 9.7 8.7 - 10.2 mg/dL   Total Protein 6.7 6.0 - 8.5 g/dL   Albumin 4.5 3.5 - 5.5 g/dL   Globulin, Total 2.2 1.5 - 4.5 g/dL   Albumin/Globulin Ratio 2.0 1.2 - 2.2   Bilirubin Total 0.3 0.0 - 1.2 mg/dL   Alkaline Phosphatase 57 39 - 117 IU/L   AST 18 0 - 40 IU/L   ALT 14 0 - 32 IU/L  Lipid Panel w/o Chol/HDL Ratio  Result Value Ref Range   Cholesterol, Total 128 100 - 199 mg/dL   Triglycerides 376 (H) 0 - 149 mg/dL   HDL 27 (L) >39 mg/dL   VLDL Cholesterol Cal 75 (H) 5 - 40 mg/dL   LDL Calculated 26 0 - 99 mg/dL  TSH  Result Value Ref Range   TSH 2.540 0.450 - 4.500 uIU/mL  UA/M w/rflx Culture, Routine  Result Value Ref Range   Specific Gravity, UA 1.020 1.005 - 1.030   pH, UA 5.5 5.0 - 7.5   Color, UA Yellow Yellow   Appearance Ur Clear Clear   Leukocytes, UA Trace (A) Negative   Protein, UA 1+ (A) Negative/Trace   Glucose, UA Negative Negative   Ketones, UA Negative Negative   RBC, UA Negative Negative   Bilirubin, UA Negative Negative   Urobilinogen, Ur 0.2 0.2 - 1.0 mg/dL   Nitrite, UA Negative Negative   Microscopic Examination See below:   Uric acid  Result Value Ref Range   Uric Acid 6.6 2.5 - 7.1 mg/dL  HgB A1c  Result Value Ref Range   Hgb A1c MFr Bld 6.7 (H) 4.8 - 5.6 %   Est. average glucose Bld gHb Est-mCnc 146 mg/dL  Rheumatoid Factor  Result Value Ref Range   Rhuematoid fact SerPl-aCnc <10.0 0.0 - 13.9 IU/mL  ANA w/Reflex  Result Value Ref Range   Anti Nuclear Antibody(ANA) Negative Negative  Sed Rate (ESR)  Result Value Ref Range   Sed Rate 25 0 - 40 mm/hr  Vitamin D (25 hydroxy)  Result Value Ref Range   Vit D, 25-Hydroxy 22.5 (L) 30.0 - 100.0  ng/mL  HIV Antibody (routine  testing w rflx)  Result Value Ref Range   HIV Screen 4th Generation wRfx Non Reactive Non Reactive      Assessment & Plan:   Problem List Items Addressed This Visit      Cardiovascular and Mediastinum   Hypertension associated with diabetes ([place]) - Primary    Stable and WNL, continue current regimen      Relevant Orders   CBC with Differential/Platelet (Completed)   Comprehensive metabolic panel (Completed)   UA/M w/rflx Culture, Routine (Completed)     Endocrine   Diabetes mellitus without complication ([place])    Recheck A1C, adjust as needed. Lifestyle modifications reviewed      Relevant Orders   HgB A1c (Completed)     Other   Insomnia    Continue trazodone and good sleep hygiene      Gout    Taking allopurinol with good relief      Relevant Orders   Uric acid (Completed)   Obesity    Lifestyle modifications reviewed      Relevant Orders   Lipid Panel w/o Chol/HDL Ratio (Completed)   TSH (Completed)   BMI 30.0-30.9,adult    Lifestyle modifications reviewed      Major depression, recurrent, chronic ([place])    Continue zoloft, counseling recommended and information given       Other Visit Diagnoses    Annual physical exam       Acute recurrent maxillary sinusitis       Referral back to ENT placed. Continue good allergy regimen   Relevant Medications   pseudoephedrine (SUDAFED) 30 MG tablet   Other Relevant Orders   Ambulatory referral to ENT   Vitamin D deficiency       Recheck vit D, adjust supplementation as needed   Relevant Orders   Vitamin D (25 hydroxy) (Completed)   Joint swelling       Will check inflammatory and rheumatologic labs for r/o. Continue OTC NSAIDs, work on weight loss   Relevant Orders   Rheumatoid Factor (Completed)   ANA w/Reflex (Completed)   Sed Rate (ESR) (Completed)   Breast cancer screening       Encounter for screening for HIV       Screening for breast cancer       Relevant Orders   MM DIGITAL SCREENING BILATERAL    Screening for HIV (human immunodeficiency virus)       Relevant Orders   HIV Antibody (routine testing w rflx) (Completed)       Follow up plan: Return in about 3 months (around 02/22/2019) for DM, Chol f/u.   LABORATORY TESTING:  - Pap smear: not applicable  IMMUNIZATIONS:   - Tdap: Tetanus vaccination status reviewed: last tetanus booster within 10 years. - Influenza: Up to date - Pneumovax: postponed - Prevnar: Not applicable - HPV: Not applicable - Zostavax vaccine: Not applicable  SCREENING: -Mammogram: Ordered today  - Colonoscopy: Up to date   PATIENT COUNSELING:   Advised to take 1 mg of folate supplement per day if capable of pregnancy.   Sexuality: Discussed sexually transmitted diseases, partner selection, use of condoms, avoidance of unintended pregnancy  and contraceptive alternatives.   Advised to avoid cigarette smoking.  I discussed with the patient that most people either abstain from alcohol or drink within safe limits (<=14/week and <=4 drinks/occasion for males, <=7/weeks and <= 3 drinks/occasion for females) and that the risk for alcohol disorders and other health effects rises proportionally  with the number of drinks per week and how often a drinker exceeds daily limits.  Discussed cessation/primary prevention of drug use and availability of treatment for abuse.   Diet: Encouraged to adjust caloric intake to maintain  or achieve ideal body weight, to reduce intake of dietary saturated fat and total fat, to limit sodium intake by avoiding high sodium foods and not adding table salt, and to maintain adequate dietary potassium and calcium preferably from fresh fruits, vegetables, and low-fat dairy products.    stressed the importance of regular exercise  Injury prevention: Discussed safety belts, safety helmets, smoke detector, smoking near bedding or upholstery.   Dental health: Discussed importance of regular tooth brushing, flossing, and dental  visits.    NEXT PREVENTATIVE PHYSICAL DUE IN 1 YEAR. Return in about 3 months (around 02/22/2019) for DM, Chol f/u.

## 2018-11-24 LAB — LIPID PANEL W/O CHOL/HDL RATIO
CHOLESTEROL TOTAL: 128 mg/dL (ref 100–199)
HDL: 27 mg/dL — ABNORMAL LOW (ref >39)
LDL Calculated: 26 mg/dL (ref 0–99)
Triglycerides: 376 mg/dL — ABNORMAL HIGH (ref 0–149)
VLDL CHOLESTEROL CAL: 75 mg/dL — AB (ref 5–40)

## 2018-11-24 LAB — CBC WITH DIFFERENTIAL/PLATELET
BASOS ABS: 0.1 10*3/uL (ref 0.0–0.2)
Basos: 1 %
EOS (ABSOLUTE): 0.2 10*3/uL (ref 0.0–0.4)
Eos: 3 %
Hematocrit: 36 % (ref 34.0–46.6)
Hemoglobin: 12.2 g/dL (ref 11.1–15.9)
Immature Grans (Abs): 0 10*3/uL (ref 0.0–0.1)
Immature Granulocytes: 1 %
LYMPHS ABS: 1.6 10*3/uL (ref 0.7–3.1)
Lymphs: 25 %
MCH: 28.8 pg (ref 26.6–33.0)
MCHC: 33.9 g/dL (ref 31.5–35.7)
MCV: 85 fL (ref 79–97)
MONOS ABS: 0.3 10*3/uL (ref 0.1–0.9)
Monocytes: 5 %
NEUTROS PCT: 65 %
Neutrophils Absolute: 4 10*3/uL (ref 1.4–7.0)
PLATELETS: 256 10*3/uL (ref 150–450)
RBC: 4.24 x10E6/uL (ref 3.77–5.28)
RDW: 13.6 % (ref 11.7–15.4)
WBC: 6.2 10*3/uL (ref 3.4–10.8)

## 2018-11-24 LAB — VITAMIN D 25 HYDROXY (VIT D DEFICIENCY, FRACTURES): Vit D, 25-Hydroxy: 22.5 ng/mL — ABNORMAL LOW (ref 30.0–100.0)

## 2018-11-24 LAB — HEMOGLOBIN A1C
Est. average glucose Bld gHb Est-mCnc: 146 mg/dL
HEMOGLOBIN A1C: 6.7 % — AB (ref 4.8–5.6)

## 2018-11-24 LAB — COMPREHENSIVE METABOLIC PANEL
ALK PHOS: 57 IU/L (ref 39–117)
ALT: 14 IU/L (ref 0–32)
AST: 18 IU/L (ref 0–40)
Albumin/Globulin Ratio: 2 (ref 1.2–2.2)
Albumin: 4.5 g/dL (ref 3.5–5.5)
BILIRUBIN TOTAL: 0.3 mg/dL (ref 0.0–1.2)
BUN / CREAT RATIO: 19 (ref 9–23)
BUN: 17 mg/dL (ref 6–24)
CHLORIDE: 105 mmol/L (ref 96–106)
CO2: 20 mmol/L (ref 20–29)
Calcium: 9.7 mg/dL (ref 8.7–10.2)
Creatinine, Ser: 0.9 mg/dL (ref 0.57–1.00)
GFR calc Af Amer: 86 mL/min/{1.73_m2} (ref >59)
GFR calc non Af Amer: 75 mL/min/{1.73_m2} (ref >59)
Globulin, Total: 2.2 g/dL (ref 1.5–4.5)
Glucose: 133 mg/dL — ABNORMAL HIGH (ref 65–99)
POTASSIUM: 4.2 mmol/L (ref 3.5–5.2)
Sodium: 145 mmol/L — ABNORMAL HIGH (ref 134–144)
Total Protein: 6.7 g/dL (ref 6.0–8.5)

## 2018-11-24 LAB — SEDIMENTATION RATE: SED RATE: 25 mm/h (ref 0–40)

## 2018-11-24 LAB — URIC ACID: Uric Acid: 6.6 mg/dL (ref 2.5–7.1)

## 2018-11-24 LAB — ANA W/REFLEX: ANA: NEGATIVE

## 2018-11-24 LAB — HIV ANTIBODY (ROUTINE TESTING W REFLEX): HIV Screen 4th Generation wRfx: NONREACTIVE

## 2018-11-24 LAB — RHEUMATOID FACTOR

## 2018-11-24 LAB — TSH: TSH: 2.54 u[IU]/mL (ref 0.450–4.500)

## 2018-11-27 DIAGNOSIS — Z6830 Body mass index (BMI) 30.0-30.9, adult: Secondary | ICD-10-CM | POA: Insufficient documentation

## 2018-11-27 DIAGNOSIS — F339 Major depressive disorder, recurrent, unspecified: Secondary | ICD-10-CM | POA: Insufficient documentation

## 2018-11-27 NOTE — Assessment & Plan Note (Signed)
Lifestyle modifications reviewed 

## 2018-11-27 NOTE — Assessment & Plan Note (Signed)
Continue zoloft, counseling recommended and information given

## 2018-11-27 NOTE — Assessment & Plan Note (Signed)
Taking allopurinol with good relief

## 2018-11-27 NOTE — Assessment & Plan Note (Signed)
Recheck A1C, adjust as needed. Lifestyle modifications reviewed 

## 2018-11-27 NOTE — Assessment & Plan Note (Signed)
Stable and WNL, continue current regimen 

## 2018-11-27 NOTE — Assessment & Plan Note (Signed)
Continue trazodone and good sleep hygiene

## 2018-12-13 ENCOUNTER — Ambulatory Visit (INDEPENDENT_AMBULATORY_CARE_PROVIDER_SITE_OTHER): Payer: Medicare HMO | Admitting: Family Medicine

## 2018-12-13 VITALS — BP 102/68 | HR 71 | Temp 97.8°F | Ht 68.0 in | Wt 199.2 lb

## 2018-12-13 DIAGNOSIS — N390 Urinary tract infection, site not specified: Secondary | ICD-10-CM | POA: Diagnosis not present

## 2018-12-13 DIAGNOSIS — E559 Vitamin D deficiency, unspecified: Secondary | ICD-10-CM | POA: Insufficient documentation

## 2018-12-13 LAB — UA/M W/RFLX CULTURE, ROUTINE
Bilirubin, UA: NEGATIVE
GLUCOSE, UA: NEGATIVE
Ketones, UA: NEGATIVE
NITRITE UA: NEGATIVE
Protein, UA: NEGATIVE
RBC, UA: NEGATIVE
Specific Gravity, UA: 1.02 (ref 1.005–1.030)
Urobilinogen, Ur: 0.2 mg/dL (ref 0.2–1.0)
pH, UA: 5 (ref 5.0–7.5)

## 2018-12-13 LAB — MICROSCOPIC EXAMINATION: BACTERIA UA: NONE SEEN

## 2018-12-13 MED ORDER — VITAMIN D (ERGOCALCIFEROL) 1.25 MG (50000 UNIT) PO CAPS: 50000.0000 [IU] | ORAL_CAPSULE | ORAL | 2 refills | Status: DC

## 2018-12-13 MED ORDER — NITROFURANTOIN MONOHYD MACRO 100 MG PO CAPS: 100.0000 mg | ORAL_CAPSULE | Freq: Two times a day (BID) | ORAL | 0 refills | Status: DC

## 2018-12-13 MED ORDER — FLUTICASONE PROPIONATE 50 MCG/ACT NA SUSP: 2.0000 | Freq: Two times a day (BID) | NASAL | 11 refills | Status: AC

## 2018-12-13 NOTE — Patient Instructions (Signed)
Biotin:  30 - 100 mcg daily

## 2018-12-13 NOTE — Progress Notes (Signed)
BP 102/68 (BP Location: Left Arm, Patient Position: Sitting, Cuff Size: Normal)   Pulse 71   Temp 97.8 F (36.6 C) (Oral)   Ht 5\' 8"  (1.727 m)   Wt 199 lb 3.2 oz (90.4 kg)   SpO2 93%   BMI 30.29 kg/m    Subjective:    Patient ID: Wendy Rivas, female    DOB: [birth year], 53 y.o.   MRN: [id number]  HPI: Wendy Rivas is a 53 y.o. female  Chief Complaint  Patient presents with  . Dysuria    Ongoing 1 week. Patient feels like she's not empyting her bladder completely.   . Urinary Retention  . Urinary Frequency   Urinary frequency, dysuria, unable to feel like she's voiding fully for about 5 days. Denies fevers, chills, back pain, N/V. Has not been able to drink as much as she knows she should due to her new work schedule. Not trying anything OTC for sxs.   Relevant past medical, surgical, family and social history reviewed and updated as indicated. Interim medical history since our last visit reviewed. Allergies and medications reviewed and updated.  Review of Systems  Per HPI unless specifically indicated above     Objective:    BP 102/68 (BP Location: Left Arm, Patient Position: Sitting, Cuff Size: Normal)   Pulse 71   Temp 97.8 F (36.6 C) (Oral)   Ht 5\' 8"  (1.727 m)   Wt 199 lb 3.2 oz (90.4 kg)   SpO2 93%   BMI 30.29 kg/m   Wt Readings from Last 3 Encounters:  12/13/18 199 lb 3.2 oz (90.4 kg)  11/23/18 201 lb (91.2 kg)  11/11/18 199 lb (90.3 kg)    Physical Exam Vitals signs and nursing note reviewed.  Constitutional:      Appearance: Normal appearance. She is not ill-appearing.  HENT:     Head: Atraumatic.  Eyes:     Extraocular Movements: Extraocular movements intact.     Conjunctiva/sclera: Conjunctivae normal.  Neck:     Musculoskeletal: Normal range of motion and neck supple.  Cardiovascular:     Rate and Rhythm: Normal rate and regular rhythm.     Heart sounds: Normal heart sounds.  Pulmonary:     Effort: Pulmonary effort is normal.     Breath  sounds: Normal breath sounds.  Abdominal:     Palpations: There is no mass.     Tenderness: There is no abdominal tenderness. There is no right CVA tenderness, left CVA tenderness or guarding.  Musculoskeletal: Normal range of motion.  Skin:    General: Skin is warm and dry.  Neurological:     Mental Status: She is alert and oriented to person, place, and time.  Psychiatric:        Mood and Affect: Mood normal.        Thought Content: Thought content normal.        Judgment: Judgment normal.     Results for orders placed or performed in visit on 12/13/18  Microscopic Examination  Result Value Ref Range   WBC, UA 0-5 0 - 5 /hpf   RBC, UA 0-2 0 - 2 /hpf   Epithelial Cells (non renal) 0-10 0 - 10 /hpf   Renal Epithel, UA 0-10 (A) None seen /hpf   Bacteria, UA None seen None seen/Few  UA/M w/rflx Culture, Routine  Result Value Ref Range   Specific Gravity, UA 1.020 1.005 - 1.030   pH, UA 5.0 5.0 - 7.5   Color, UA  Yellow Yellow   Appearance Ur Clear Clear   Leukocytes, UA Trace (A) Negative   Protein, UA Negative Negative/Trace   Glucose, UA Negative Negative   Ketones, UA Negative Negative   RBC, UA Negative Negative   Bilirubin, UA Negative Negative   Urobilinogen, Ur 0.2 0.2 - 1.0 mg/dL   Nitrite, UA Negative Negative   Microscopic Examination See below:       Assessment & Plan:   Problem List Items Addressed This Visit      Other   Vitamin D deficiency    Requesting once weekly dosed vit d supplement - rx sent       Other Visit Diagnoses    Acute lower UTI    -  Primary   Mild UTI on U/A, await cx. Treat with macrobid given significant sxs. Push fluids, probiotics, f/u if worsening or not improving   Relevant Medications   nitrofurantoin, macrocrystal-monohydrate, (MACROBID) 100 MG capsule   Other Relevant Orders   UA/M w/rflx Culture, Routine (Completed)       Follow up plan: Return for as scheduled.

## 2018-12-15 NOTE — Assessment & Plan Note (Signed)
Requesting once weekly dosed vit d supplement - rx sent

## 2019-01-09 ENCOUNTER — Telehealth: Payer: Self-pay | Admitting: Family Medicine

## 2019-01-09 NOTE — Telephone Encounter (Signed)
Copied from CRM [id number]. Topic: Medicare AWV >> Jan 09, 2019 11:05 AM Earlyne Iba wrote: Reason for CRM: Called to schedule medicare annual wellness visit with NHA- Tiffany Hill,LPN at [facility]. No answer.  Left message on [employer]. Please schedule anytime.  Any questions please contact Trixie Rude on skype or by phone- [phone].

## 2019-01-10 ENCOUNTER — Ambulatory Visit (INDEPENDENT_AMBULATORY_CARE_PROVIDER_SITE_OTHER): Payer: Medicare HMO | Admitting: Family Medicine

## 2019-01-10 ENCOUNTER — Encounter: Payer: Self-pay | Admitting: Family Medicine

## 2019-01-10 VITALS — BP 130/77 | HR 112 | Temp 98.9°F | Ht 68.0 in | Wt 197.8 lb

## 2019-01-10 DIAGNOSIS — J020 Streptococcal pharyngitis: Secondary | ICD-10-CM | POA: Diagnosis not present

## 2019-01-10 DIAGNOSIS — J111 Influenza due to unidentified influenza virus with other respiratory manifestations: Secondary | ICD-10-CM

## 2019-01-10 DIAGNOSIS — J029 Acute pharyngitis, unspecified: Secondary | ICD-10-CM

## 2019-01-10 LAB — RAPID STREP SCREEN (MED CTR MEBANE ONLY): Strep Gp A Ag, IA W/Reflex: POSITIVE — AB

## 2019-01-10 LAB — VERITOR FLU A/B WAIVED
INFLUENZA A: NEGATIVE
INFLUENZA B: NEGATIVE

## 2019-01-10 MED ORDER — TRAZODONE HCL 50 MG PO TABS: 100.0000 mg | ORAL_TABLET | Freq: Every day | ORAL | 2 refills | Status: DC

## 2019-01-10 MED ORDER — OSELTAMIVIR PHOSPHATE 75 MG PO CAPS: 75.0000 mg | ORAL_CAPSULE | Freq: Two times a day (BID) | ORAL | 0 refills | Status: DC

## 2019-01-10 MED ORDER — AMOXICILLIN-POT CLAVULANATE 875-125 MG PO TABS: 1.0000 | ORAL_TABLET | Freq: Two times a day (BID) | ORAL | 0 refills | Status: DC

## 2019-01-10 MED ORDER — KETOROLAC TROMETHAMINE 60 MG/2ML IM SOLN
60.0000 mg | Freq: Once | INTRAMUSCULAR | Status: AC
  Administered 2019-01-10: 60 mg via INTRAMUSCULAR

## 2019-01-10 MED ORDER — ONDANSETRON 4 MG PO TBDP: 4.0000 mg | ORAL_TABLET | Freq: Three times a day (TID) | ORAL | 0 refills | Status: DC | PRN

## 2019-01-10 NOTE — Progress Notes (Signed)
BP 130/77 (BP Location: Left Arm, Patient Position: Sitting, Cuff Size: Normal)   Pulse (!) 112   Temp 98.9 F (37.2 C) (Oral)   Ht 5\' 8"  (1.727 m)   Wt 197 lb 12.8 oz (89.7 kg)   SpO2 91%   BMI 30.08 kg/m    Subjective:    Patient ID: Wendy Rivas, female    DOB: 1966/04/18, 53 y.o.   MRN: [id number]  HPI: Wendy Rivas is a 53 y.o. female  Chief Complaint  Patient presents with  . Sore Throat    Ongoing since last night. Just got back from [place].   . Ear Pain  . Sinusitis  . Chills  . Diarrhea   Here today with 1 week of sore throat that has been severe and worsening, now can barely swallow her own saliva. Now also having 1 day of fever, body aches, sinus pain and pressure, chills, sweats, ear pain. Recent traveled home from [place] where she spent time in the hospital visiting her grandbaby. Denies CP, SOB, N/V/D. Trying dayquil and nyquil with no relief. Hx of allergic rhinitis on regular allergy regimen.    Relevant past medical, surgical, family and social history reviewed and updated as indicated. Interim medical history since our last visit reviewed. Allergies and medications reviewed and updated.  Review of Systems  Per HPI unless specifically indicated above     Objective:    BP 130/77 (BP Location: Left Arm, Patient Position: Sitting, Cuff Size: Normal)   Pulse (!) 112   Temp 98.9 F (37.2 C) (Oral)   Ht 5\' 8"  (1.727 m)   Wt 197 lb 12.8 oz (89.7 kg)   SpO2 91%   BMI 30.08 kg/m   Wt Readings from Last 3 Encounters:  01/10/19 197 lb 12.8 oz (89.7 kg)  12/13/18 199 lb 3.2 oz (90.4 kg)  11/23/18 201 lb (91.2 kg)    Physical Exam Vitals signs and nursing note reviewed.  Constitutional:      Appearance: She is ill-appearing.  HENT:     Head: Atraumatic.     Right Ear: Tympanic membrane and external ear normal.     Left Ear: Tympanic membrane and external ear normal.     Nose: Congestion present.     Mouth/Throat:     Mouth: Mucous membranes are moist.      Pharynx: Posterior oropharyngeal erythema present.     Comments: B/l tonsillar edema and exudates Eyes:     Extraocular Movements: Extraocular movements intact.     Conjunctiva/sclera: Conjunctivae normal.  Neck:     Musculoskeletal: Normal range of motion and neck supple.  Cardiovascular:     Rate and Rhythm: Regular rhythm. Tachycardia present.     Heart sounds: Normal heart sounds.  Pulmonary:     Effort: Pulmonary effort is normal.     Breath sounds: Wheezing present.  Musculoskeletal: Normal range of motion.  Lymphadenopathy:     Cervical: Cervical adenopathy present.  Skin:    General: Skin is warm and dry.  Neurological:     Mental Status: She is alert and oriented to person, place, and time.  Psychiatric:        Mood and Affect: Mood normal.        Thought Content: Thought content normal.     Results for orders placed or performed in visit on 01/10/19  Rapid Strep Screen ([facility] ONLY)  Result Value Ref Range   Strep Gp A Ag, IA W/Reflex Positive (A) Negative  Veritor Flu A/B Waived  Result Value Ref Range   Influenza A Negative Negative   Influenza B Negative Negative      Assessment & Plan:   Problem List Items Addressed This Visit    None    Visit Diagnoses    Strep pharyngitis    -  Primary   Rapid strep pos, tx with augmentin, supportive care. F/u if worsening or not improving   Relevant Medications   ketorolac (TORADOL) injection 60 mg (Completed)   oseltamivir (TAMIFLU) 75 MG capsule   Other Relevant Orders   Rapid Strep Screen ([facility] ONLY) (Completed)   Veritor Flu A/B Waived (Completed)   Influenza       Rapid flu neg but sxs consistent. Tx with tamiflu, supportive care. Strict return precautions given   Relevant Medications   oseltamivir (TAMIFLU) 75 MG capsule       Follow up plan: Return for as scheduled.

## 2019-01-10 NOTE — Telephone Encounter (Signed)
zofran sent

## 2019-01-10 NOTE — Telephone Encounter (Signed)
Patient would like for you to send a script for nausea to [facility] on [address]  Thank you

## 2019-01-18 ENCOUNTER — Ambulatory Visit (INDEPENDENT_AMBULATORY_CARE_PROVIDER_SITE_OTHER): Payer: Medicare HMO | Admitting: Family Medicine

## 2019-01-18 ENCOUNTER — Encounter: Payer: Self-pay | Admitting: Family Medicine

## 2019-01-18 ENCOUNTER — Other Ambulatory Visit: Payer: Self-pay

## 2019-01-18 VITALS — BP 115/79 | HR 83 | Temp 97.8°F | Ht 68.0 in | Wt 199.5 lb

## 2019-01-18 DIAGNOSIS — G4700 Insomnia, unspecified: Secondary | ICD-10-CM | POA: Diagnosis not present

## 2019-01-18 DIAGNOSIS — J029 Acute pharyngitis, unspecified: Secondary | ICD-10-CM | POA: Diagnosis not present

## 2019-01-18 DIAGNOSIS — R350 Frequency of micturition: Secondary | ICD-10-CM

## 2019-01-18 DIAGNOSIS — B373 Candidiasis of vulva and vagina: Secondary | ICD-10-CM | POA: Diagnosis not present

## 2019-01-18 DIAGNOSIS — B3731 Acute candidiasis of vulva and vagina: Secondary | ICD-10-CM

## 2019-01-18 LAB — UA/M W/RFLX CULTURE, ROUTINE
Bilirubin, UA: NEGATIVE
Glucose, UA: NEGATIVE
Ketones, UA: NEGATIVE
LEUKOCYTES UA: NEGATIVE
Nitrite, UA: NEGATIVE
Protein, UA: NEGATIVE
RBC, UA: NEGATIVE
Specific Gravity, UA: 1.02 (ref 1.005–1.030)
Urobilinogen, Ur: 0.2 mg/dL (ref 0.2–1.0)
pH, UA: 5 (ref 5.0–7.5)

## 2019-01-18 MED ORDER — ALLOPURINOL 100 MG PO TABS: 100.0000 mg | ORAL_TABLET | Freq: Every day | ORAL | 1 refills | Status: DC

## 2019-01-18 MED ORDER — TRAZODONE HCL 150 MG PO TABS: 150.0000 mg | ORAL_TABLET | Freq: Every day | ORAL | 1 refills | Status: DC

## 2019-01-18 MED ORDER — SERTRALINE HCL 100 MG PO TABS: 100.0000 mg | ORAL_TABLET | Freq: Every day | ORAL | 1 refills | Status: AC

## 2019-01-18 MED ORDER — LISINOPRIL 2.5 MG PO TABS: 2.5000 mg | ORAL_TABLET | Freq: Every day | ORAL | 1 refills | Status: AC

## 2019-01-18 MED ORDER — PANTOPRAZOLE SODIUM 40 MG PO TBEC: 40.0000 mg | DELAYED_RELEASE_TABLET | Freq: Every day | ORAL | 1 refills | Status: DC

## 2019-01-18 MED ORDER — FLUCONAZOLE 150 MG PO TABS: 150.0000 mg | ORAL_TABLET | Freq: Once | ORAL | 0 refills | Status: DC

## 2019-01-18 MED ORDER — FLUCONAZOLE 150 MG PO TABS: 150.0000 mg | ORAL_TABLET | Freq: Once | ORAL | 0 refills | Status: AC

## 2019-01-18 NOTE — Progress Notes (Signed)
BP 115/79   Pulse 83   Temp 97.8 F (36.6 C) (Oral)   Ht 5\' 8"  (1.727 m)   Wt 199 lb 8 oz (90.5 kg)   SpO2 96%   BMI 30.33 kg/m    Subjective:    Patient ID: Wendy Rivas, female    DOB: 1966-04-17, 53 y.o.   MRN: [id number]  HPI: Wendy Rivas is a 53 y.o. female  Chief Complaint  Patient presents with  . Sore Throat  . Sinus Problem  . Medication Management    Needs Trazadone RX increased to TID and refills on lisinopril, zoloft, allopurinol and protonix   Here today with persistent fatigue, sore throat and sinus drainage after strep and flu dx 1 week ago. Feeling much better overall though. Still has a few abx left, finished the tamiflu. Denies fevers, CP, SOB.   Thinks she's getting a yeast infection from the abx. Also having urinary frequency the past week or so, hx of frequent UTIs and wanting to be checked. No N/V/D, abdominal pain, low back pain beyond baseline, fevers, hematuria.   Wanting to increase her trazodone permanently to 150 mg. Has found that taking 3 of her 50 mg tabs works the best for her. Denies grogginess in the morning or other side effects.   Depression screen [facility] 2/9 11/23/2018 11/11/2018  Decreased Interest 1 1  Down, Depressed, Hopeless 3 3  PHQ - 2 Score 4 4  Altered sleeping 3 3  Tired, decreased energy 3 3  Change in appetite 3 3  Feeling bad or failure about yourself  3 3  Trouble concentrating 3 3  Moving slowly or fidgety/restless 0 0  Suicidal thoughts 2 2  PHQ-9 Score 21 21   GAD 7 : Generalized Anxiety Score 11/23/2018 11/11/2018  Nervous, Anxious, on Edge 2 3  Control/stop worrying 3 3  Worry too much - different things 3 3  Trouble relaxing 2 1  Restless 0 0  Easily annoyed or irritable 2 3  Afraid - awful might happen 3 1  Total GAD 7 Score 15 14  Anxiety Difficulty - Very difficult     Relevant past medical, surgical, family and social history reviewed and updated as indicated. Interim medical history since our last visit  reviewed. Allergies and medications reviewed and updated.  Review of Systems  Per HPI unless specifically indicated above     Objective:    BP 115/79   Pulse 83   Temp 97.8 F (36.6 C) (Oral)   Ht 5\' 8"  (1.727 m)   Wt 199 lb 8 oz (90.5 kg)   SpO2 96%   BMI 30.33 kg/m   Wt Readings from Last 3 Encounters:  01/18/19 199 lb 8 oz (90.5 kg)  01/10/19 197 lb 12.8 oz (89.7 kg)  12/13/18 199 lb 3.2 oz (90.4 kg)    Physical Exam Vitals signs and nursing note reviewed.  Constitutional:      Appearance: Normal appearance. She is not ill-appearing.  HENT:     Head: Atraumatic.     Right Ear: Tympanic membrane normal.     Left Ear: Tympanic membrane normal.     Nose: Rhinorrhea present.     Mouth/Throat:     Pharynx: Posterior oropharyngeal erythema present. No oropharyngeal exudate.  Eyes:     Extraocular Movements: Extraocular movements intact.     Conjunctiva/sclera: Conjunctivae normal.  Neck:     Musculoskeletal: Normal range of motion and neck supple.  Cardiovascular:     Rate  and Rhythm: Normal rate and regular rhythm.     Heart sounds: Normal heart sounds.  Pulmonary:     Effort: Pulmonary effort is normal.     Breath sounds: Normal breath sounds.  Abdominal:     General: Bowel sounds are normal.     Palpations: Abdomen is soft. There is no mass.     Tenderness: There is no abdominal tenderness. There is no right CVA tenderness, left CVA tenderness or guarding.  Genitourinary:    Vagina: Vaginal discharge present.  Musculoskeletal: Normal range of motion.  Skin:    General: Skin is warm and dry.  Neurological:     Mental Status: She is alert and oriented to person, place, and time.  Psychiatric:        Mood and Affect: Mood normal.        Thought Content: Thought content normal.        Judgment: Judgment normal.     Results for orders placed or performed in visit on 01/18/19  UA/M w/rflx Culture, Routine  Result Value Ref Range   Specific Gravity, UA  1.020 1.005 - 1.030   pH, UA 5.0 5.0 - 7.5   Color, UA Yellow Yellow   Appearance Ur Clear Clear   Leukocytes, UA Negative Negative   Protein, UA Negative Negative/Trace   Glucose, UA Negative Negative   Ketones, UA Negative Negative   RBC, UA Negative Negative   Bilirubin, UA Negative Negative   Urobilinogen, Ur 0.2 0.2 - 1.0 mg/dL   Nitrite, UA Negative Negative      Assessment & Plan:   Problem List Items Addressed This Visit      Other   Insomnia    Getting excellent benefit from 150 mg trazodone QHS. Will increase dose to reduce pill burden. Sleep hygiene reviewed       Other Visit Diagnoses    Sore throat    -  Primary   + strep test 1 week ago, improving on augmentin. Finish course, supportive care. Return precautions reviewed. Continue good allergy regimen   Urinary frequency       U/a benign today, suspect from yeast infection. Tx with diflucan and montior for improvement. Recheck if worsening   Relevant Orders   UA/M w/rflx Culture, Routine (Completed)   Vaginal candidiasis       From current abx. Tx with diflucan, probiotics. F/u if not improving       Follow up plan: Return for as scheduled.

## 2019-01-22 NOTE — Assessment & Plan Note (Signed)
Getting excellent benefit from 150 mg trazodone QHS. Will increase dose to reduce pill burden. Sleep hygiene reviewed

## 2019-02-22 ENCOUNTER — Ambulatory Visit: Payer: Medicare HMO | Admitting: Family Medicine

## 2019-02-22 ENCOUNTER — Other Ambulatory Visit: Payer: Self-pay

## 2019-04-12 ENCOUNTER — Ambulatory Visit (INDEPENDENT_AMBULATORY_CARE_PROVIDER_SITE_OTHER): Payer: Medicare HMO | Admitting: Family Medicine

## 2019-04-12 ENCOUNTER — Other Ambulatory Visit: Payer: Self-pay

## 2019-04-12 ENCOUNTER — Encounter: Payer: Self-pay | Admitting: Family Medicine

## 2019-04-12 VITALS — Temp 98.9°F | Ht 68.0 in | Wt 192.0 lb

## 2019-04-12 DIAGNOSIS — R1032 Left lower quadrant pain: Secondary | ICD-10-CM

## 2019-04-12 MED ORDER — ONDANSETRON 4 MG PO TBDP: 4.0000 mg | ORAL_TABLET | Freq: Three times a day (TID) | ORAL | 0 refills | Status: AC | PRN

## 2019-04-12 MED ORDER — METRONIDAZOLE 500 MG PO TABS: 500.0000 mg | ORAL_TABLET | Freq: Three times a day (TID) | ORAL | 0 refills | Status: DC

## 2019-04-12 MED ORDER — CIPROFLOXACIN HCL 500 MG PO TABS: 500.0000 mg | ORAL_TABLET | Freq: Two times a day (BID) | ORAL | 0 refills | Status: DC

## 2019-04-12 NOTE — Progress Notes (Signed)
Temp 98.9 F (37.2 C) (Oral)   Ht 5\' 8"  (1.727 m)   Wt 192 lb (87.1 kg)   BMI 29.19 kg/m    Subjective:    Patient ID: Wendy Rivas, female    DOB: [birth year], 53 y.o.   MRN: [id number]  HPI: Wendy Rivas is a 53 y.o. female  Chief Complaint  Patient presents with  . Abdominal Pain    left side. symptoms started over the weekend.   . Diarrhea  . Constipation  . Nausea  . Medication Refill    zofran    . This visit was completed via WebEx due to the restrictions of the COVID-19 pandemic. All issues as above were discussed and addressed. Physical exam was done as above through visual confirmation on WebEx. If it was felt that the patient should be evaluated in the office, they were directed there. The patient verbally consented to this visit. . Location of the patient: home . Location of the provider: work . Those involved with this call:  . Provider: Roosvelt Maser, PA-C . CMA: Elton Sin, CMA . Front Desk/Registration: Harriet Pho  . Time spent on call: 20 minutes with patient face to face via video conference. More than 50% of this time was spent in counseling and coordination of care. 5 minutes total spent in review of patient's record and preparation of their chart. I verified patient identity using two factors (patient name and date of birth). Patient consents verbally to being seen via telemedicine visit today.   3 days of nausea, worsened diarrhea beyond her typical chronic diarrhea, lower left abdominal pain, and now today constipated and worsening abdominal pain. Denies fevers, melena, vomiting, recent travel, sick contacts, cough, CP, SOB. Taking zofran with minimal relief. Hx of diverticulitis and states this feels very similar to those flares.   Relevant past medical, surgical, family and social history reviewed and updated as indicated. Interim medical history since our last visit reviewed. Allergies and medications reviewed and updated.  Review of Systems  Per  HPI unless specifically indicated above     Objective:    Temp 98.9 F (37.2 C) (Oral)   Ht 5\' 8"  (1.727 m)   Wt 192 lb (87.1 kg)   BMI 29.19 kg/m   Wt Readings from Last 3 Encounters:  04/14/19 189 lb (85.7 kg)  04/14/19 189 lb (85.7 kg)  04/12/19 192 lb (87.1 kg)    Physical Exam Vitals signs and nursing note reviewed.  Constitutional:      General: She is not in acute distress.    Appearance: Normal appearance.  HENT:     Head: Atraumatic.     Right Ear: External ear normal.     Left Ear: External ear normal.     Nose: Nose normal. No congestion.     Mouth/Throat:     Mouth: Mucous membranes are moist.     Pharynx: Oropharynx is clear. No posterior oropharyngeal erythema.  Eyes:     Extraocular Movements: Extraocular movements intact.     Conjunctiva/sclera: Conjunctivae normal.  Neck:     Musculoskeletal: Normal range of motion.  Cardiovascular:     Comments: Unable to assess via virtual visit Pulmonary:     Effort: Pulmonary effort is normal. No respiratory distress.  Abdominal:     General: Bowel sounds are normal.     Palpations: Abdomen is soft.     Tenderness: There is abdominal tenderness (lower abdomen ttp per patient's self palpation).  Musculoskeletal: Normal range of motion.  Skin:    General: Skin is dry.     Findings: No erythema.  Neurological:     Mental Status: She is alert and oriented to person, place, and time.  Psychiatric:        Mood and Affect: Mood normal.        Thought Content: Thought content normal.        Judgment: Judgment normal.     Results for orders placed or performed in visit on 01/18/19  UA/M w/rflx Culture, Routine  Result Value Ref Range   Specific Gravity, UA 1.020 1.005 - 1.030   pH, UA 5.0 5.0 - 7.5   Color, UA Yellow Yellow   Appearance Ur Clear Clear   Leukocytes, UA Negative Negative   Protein, UA Negative Negative/Trace   Glucose, UA Negative Negative   Ketones, UA Negative Negative   RBC, UA Negative  Negative   Bilirubin, UA Negative Negative   Urobilinogen, Ur 0.2 0.2 - 1.0 mg/dL   Nitrite, UA Negative Negative      Assessment & Plan:   Problem List Items Addressed This Visit    None    Visit Diagnoses    Left lower quadrant abdominal pain    -  Primary   Tx for suspected diverticulitis. BRAT diet, zofran, push fluids. Strict return precautions given for in person exam and labs/imaging if worsening       Follow up plan: Return if symptoms worsen or fail to improve.

## 2019-04-14 ENCOUNTER — Other Ambulatory Visit: Payer: Self-pay

## 2019-04-14 ENCOUNTER — Encounter: Payer: Self-pay | Admitting: *Deleted

## 2019-04-14 ENCOUNTER — Emergency Department
Admission: EM | Admit: 2019-04-14 | Discharge: 2019-04-14 | Disposition: A | Discharge status: Home / Self Care | Payer: Medicare HMO | Attending: Emergency Medicine | Admitting: Emergency Medicine

## 2019-04-14 ENCOUNTER — Emergency Department: Payer: Medicare HMO

## 2019-04-14 ENCOUNTER — Encounter: Payer: Self-pay | Admitting: Family Medicine

## 2019-04-14 ENCOUNTER — Ambulatory Visit (INDEPENDENT_AMBULATORY_CARE_PROVIDER_SITE_OTHER): Payer: Medicare HMO | Admitting: Family Medicine

## 2019-04-14 VITALS — Wt 189.0 lb

## 2019-04-14 DIAGNOSIS — Z7984 Long term (current) use of oral hypoglycemic drugs: Secondary | ICD-10-CM | POA: Insufficient documentation

## 2019-04-14 DIAGNOSIS — R197 Diarrhea, unspecified: Secondary | ICD-10-CM

## 2019-04-14 DIAGNOSIS — A029 Salmonella infection, unspecified: Secondary | ICD-10-CM | POA: Diagnosis not present

## 2019-04-14 DIAGNOSIS — I10 Essential (primary) hypertension: Secondary | ICD-10-CM | POA: Insufficient documentation

## 2019-04-14 DIAGNOSIS — R1032 Left lower quadrant pain: Secondary | ICD-10-CM | POA: Diagnosis present

## 2019-04-14 DIAGNOSIS — K5732 Diverticulitis of large intestine without perforation or abscess without bleeding: Secondary | ICD-10-CM | POA: Diagnosis not present

## 2019-04-14 DIAGNOSIS — E119 Type 2 diabetes mellitus without complications: Secondary | ICD-10-CM | POA: Diagnosis not present

## 2019-04-14 DIAGNOSIS — Z79899 Other long term (current) drug therapy: Secondary | ICD-10-CM | POA: Insufficient documentation

## 2019-04-14 DIAGNOSIS — R112 Nausea with vomiting, unspecified: Secondary | ICD-10-CM | POA: Diagnosis not present

## 2019-04-14 DIAGNOSIS — K5792 Diverticulitis of intestine, part unspecified, without perforation or abscess without bleeding: Secondary | ICD-10-CM

## 2019-04-14 LAB — GASTROINTESTINAL PANEL BY PCR, STOOL (REPLACES STOOL CULTURE)

## 2019-04-14 LAB — COMPREHENSIVE METABOLIC PANEL
ALT: 17 U/L (ref 0–44)
AST: 22 U/L (ref 15–41)
Albumin: 4.5 g/dL (ref 3.5–5.0)
Alkaline Phosphatase: 57 U/L (ref 38–126)
Anion gap: 11 (ref 5–15)
BUN: 17 mg/dL (ref 6–20)
CO2: 22 mmol/L (ref 22–32)
Calcium: 9.1 mg/dL (ref 8.9–10.3)
Chloride: 105 mmol/L (ref 98–111)
Creatinine, Ser: 0.78 mg/dL (ref 0.44–1.00)
GFR calc Af Amer: >60 mL/min (ref >60)
GFR calc non Af Amer: >60 mL/min (ref >60)
Glucose, Bld: 149 mg/dL — ABNORMAL HIGH (ref 70–99)
Potassium: 3.9 mmol/L (ref 3.5–5.1)
Sodium: 138 mmol/L (ref 135–145)
Total Bilirubin: 0.8 mg/dL (ref 0.3–1.2)
Total Protein: 7.5 g/dL (ref 6.5–8.1)

## 2019-04-14 LAB — CBC
HCT: 35.4 % — ABNORMAL LOW (ref 36.0–46.0)
Hemoglobin: 12.1 g/dL (ref 12.0–15.0)
MCH: 28.5 pg (ref 26.0–34.0)
MCHC: 34.2 g/dL (ref 30.0–36.0)
MCV: 83.3 fL (ref 80.0–100.0)
Platelets: 193 10*3/uL (ref 150–400)
RBC: 4.25 MIL/uL (ref 3.87–5.11)
RDW: 13.5 % (ref 11.5–15.5)
WBC: 8.2 10*3/uL (ref 4.0–10.5)
nRBC: 0 % (ref 0.0–0.2)

## 2019-04-14 LAB — CLOSTRIDIUM DIFFICILE BY PCR, REFLEXED: Toxigenic C. Difficile by PCR: NEGATIVE

## 2019-04-14 LAB — C DIFFICILE QUICK SCREEN W PCR REFLEX
C Diff antigen: POSITIVE — AB
C Diff toxin: NEGATIVE

## 2019-04-14 LAB — LIPASE, BLOOD: Lipase: 42 U/L (ref 11–51)

## 2019-04-14 MED ORDER — SODIUM CHLORIDE 0.9 % IV BOLUS
1000.0000 mL | Freq: Once | INTRAVENOUS | Status: AC
  Administered 2019-04-14: 1000 mL via INTRAVENOUS

## 2019-04-14 MED ORDER — IOHEXOL 300 MG/ML  SOLN
100.0000 mL | Freq: Once | INTRAMUSCULAR | Status: AC | PRN
  Administered 2019-04-14: 100 mL via INTRAVENOUS

## 2019-04-14 MED ORDER — METOCLOPRAMIDE HCL 10 MG PO TABS: 10.0000 mg | ORAL_TABLET | Freq: Three times a day (TID) | ORAL | 0 refills | Status: AC | PRN

## 2019-04-14 MED ORDER — METOCLOPRAMIDE HCL 5 MG/ML IJ SOLN
10.0000 mg | Freq: Once | INTRAMUSCULAR | Status: AC
  Administered 2019-04-14: 10 mg via INTRAVENOUS
  Filled 2019-04-14: qty 2

## 2019-04-14 MED ORDER — TRAMADOL HCL 50 MG PO TABS: 50.0000 mg | ORAL_TABLET | Freq: Four times a day (QID) | ORAL | 0 refills | Status: DC | PRN

## 2019-04-14 MED ORDER — AMOXICILLIN-POT CLAVULANATE 875-125 MG PO TABS: 1.0000 | ORAL_TABLET | Freq: Three times a day (TID) | ORAL | 0 refills | Status: AC

## 2019-04-14 NOTE — ED Notes (Signed)
First Nurse Note: Pt to desk to check on wait time. Pt informed that there is currently 1 person ahead of her.

## 2019-04-14 NOTE — ED Notes (Signed)
First Nurse Note: Pt sent from PCP office for LLQ abd pain, N/V/D. Pt has been on antibiotics but symptoms are not getting any better. Pt is in NAD.

## 2019-04-14 NOTE — ED Provider Notes (Signed)
[facility] Emergency Department Provider Note   ____________________________________________   I have reviewed the triage vital signs and the nursing notes.   HISTORY  Chief Complaint Abdominal Pain   History limited by: Not Limited   HPI Wendy Rivas is a 53 y.o. female who presents to the emergency department today at the advice of PCP to obtain CT scan and have stool sample sent. The patient states that three days ago she started having pain in her left lower quadrant. Has history of diverticulitis. Called her provider the next day and was started on cipro and flagyl. States that the pain has continued to get worse since then. The patient says that she has chronic diarrhea. Has continued to have diarrhea since the pain started. She denies any fevers.    Records reviewed. Per medical record review patient has a history of HTN, DM  Past Medical History:  Diagnosis Date  . Anxiety   . Depression   . Diabetes mellitus without complication (HCC)   . Diverticulitis   . Gout   . Hypertension   . Kidney stones     Patient Active Problem List   Diagnosis Date Noted  . Vitamin D deficiency 12/13/2018  . BMI 30.0-30.9,adult 11/27/2018  . Major depression, recurrent, chronic (HCC) 11/27/2018  . Obesity 11/23/2018  . Insomnia 11/16/2018  . Gout 11/16/2018  . Hypertension associated with diabetes (HCC) 11/16/2018  . Diabetes mellitus without complication (HCC) 11/16/2018  . Allergic rhinitis 11/11/2018    Past Surgical History:  Procedure Laterality Date  . ABDOMINAL HYSTERECTOMY    . CESAREAN SECTION    . CHOLECYSTECTOMY    . ELBOW SURGERY    . kidney sugery    . NASAL SINUS SURGERY      Prior to Admission medications   Medication Sig Start Date End Date Taking? Authorizing Provider  acetaminophen (TYLENOL) 500 MG tablet Take 500-1,000 mg by mouth every 6 (six) hours as needed for mild pain or moderate pain.    [provider]   allopurinol (ZYLOPRIM) 100 MG tablet Take 1 tablet (100 mg total) by mouth daily. 01/18/19   Particia Nearing, PA-C  ciprofloxacin (CIPRO) 500 MG tablet Take 1 tablet (500 mg total) by mouth 2 (two) times daily. 04/12/19   Particia Nearing, PA-C  fluticasone [facility]) 50 MCG/ACT nasal spray Place 2 sprays into both nostrils 2 (two) times daily. 12/13/18   Particia Nearing, PA-C  lisinopril (PRINIVIL,ZESTRIL) 2.5 MG tablet Take 1 tablet (2.5 mg total) by mouth daily. 01/18/19   Particia Nearing, PA-C  metFORMIN (GLUCOPHAGE) 1000 MG tablet Take 1 tablet (1,000 mg total) by mouth 2 (two) times daily with a meal. 11/11/18   Particia Nearing, PA-C  metroNIDAZOLE (FLAGYL) 500 MG tablet Take 1 tablet (500 mg total) by mouth 3 (three) times daily. 04/12/19   Particia Nearing, PA-C  montelukast (SINGULAIR) 10 MG tablet Take 1 tablet (10 mg total) by mouth at bedtime. 11/23/18   Particia Nearing, PA-C  Multiple Vitamin (MULTIVITAMINS PO) Take by mouth daily. Women vitamins    [provider]  ondansetron (ZOFRAN ODT) 4 MG disintegrating tablet Take 1 tablet (4 mg total) by mouth every 8 (eight) hours as needed. 04/12/19   Particia Nearing, PA-C  pantoprazole (PROTONIX) 40 MG tablet Take 1 tablet (40 mg total) by mouth daily. 01/18/19   Particia Nearing, PA-C  pseudoephedrine (SUDAFED) 30 MG tablet Take 30 mg by mouth every 4 (four)  hours as needed for congestion.    [provider]  sertraline (ZOLOFT) 100 MG tablet Take 1 tablet (100 mg total) by mouth daily. 01/18/19   Particia Nearing, PA-C  traZODone (DESYREL) 150 MG tablet Take 1 tablet (150 mg total) by mouth at bedtime. 01/18/19   Particia Nearing, PA-C    Allergies Januvia [sitagliptin]  Family History  Problem Relation Age of Onset  . Diabetes Mother   . Hypertension Mother   . Heart disease Mother   . Depression Mother   . Heart disease Father   . Hypertension Father   .  Kidney disease Father   . Heart disease Sister   . Hypertension Sister   . Diabetes Brother   . Heart disease Brother   . Hypertension Brother   . Diabetes Maternal Grandmother   . Alzheimer's disease Paternal Grandfather     Social History Social History   Tobacco Use  . Smoking status: Never Smoker  . Smokeless tobacco: Never Used  Substance Use Topics  . Alcohol use: No    Frequency: Never  . Drug use: No    Review of Systems Constitutional: No fever/chills Eyes: No visual changes. ENT: No sore throat. Cardiovascular: Denies chest pain. Respiratory: Denies shortness of breath. Gastrointestinal: Positive for abdominal pain, diarrhea.  Genitourinary: Negative for dysuria. Musculoskeletal: Negative for back pain. Skin: Negative for rash. Neurological: Negative for headaches, focal weakness or numbness.  ____________________________________________   PHYSICAL EXAM:  VITAL SIGNS: ED Triage Vitals  Enc Vitals Group     BP 04/14/19 1158 106/76     Pulse Rate 04/14/19 1158 97     Resp 04/14/19 1158 16     Temp 04/14/19 1158 98.9 F (37.2 C)     Temp Source 04/14/19 1158 Oral     SpO2 04/14/19 1158 94 %     Weight 04/14/19 1158 189 lb (85.7 kg)     Height 04/14/19 1158 5\' 8"  (1.727 m)     Head Circumference --      Peak Flow --      Pain Score 04/14/19 1204 8   Constitutional: Alert and oriented.  Eyes: Conjunctivae are normal.  ENT      Head: Normocephalic and atraumatic.      Nose: No congestion/rhinnorhea.      Mouth/Throat: Mucous membranes are moist.      Neck: No stridor. Hematological/Lymphatic/Immunilogical: No cervical lymphadenopathy. Cardiovascular: Normal rate, regular rhythm.  No murmurs, rubs, or gallops. Respiratory: Normal respiratory effort without tachypnea nor retractions. Breath sounds are clear and equal bilaterally. No wheezes/rales/rhonchi. Gastrointestinal: Soft and tender to palpation in the left lower quadrant.  Genitourinary:  Deferred Musculoskeletal: Normal range of motion in all extremities. No lower extremity edema. Neurologic:  Normal speech and language. No gross focal neurologic deficits are appreciated.  Skin:  Skin is warm, dry and intact. No rash noted. Psychiatric: Mood and affect are normal. Speech and behavior are normal. Patient exhibits appropriate insight and judgment.  ____________________________________________    LABS (pertinent positives/negatives)  C.diff by PCR negative toxigenic c.dif C diff antigen positive C diff toxin negative Lipase 42 CMP wnl except glu 149 CBC wbc 8.2, hgb 12.1, plt 193 GI palen salmonella detected ____________________________________________   EKG  None  ____________________________________________    RADIOLOGY  CT abd.pel Diverticulitis without complicating findings  ____________________________________________   PROCEDURES  Procedures  ____________________________________________   INITIAL IMPRESSION / ASSESSMENT AND PLAN / ED COURSE  Pertinent labs & imaging results that were  available during my care of the patient were reviewed by me and considered in my medical decision making (see chart for details).   Patient presented to the emergency department today because of concerns for continued left lower quadrant abdominal pain.  Patient's work-up was notable for both positive Salmonella as well as uncomplicated diverticulitis.  At this point I had a discussion with the patient matter findings.  I did offer admission for IV antibiotics given that she has not had significant provement on oral antibiotics.  However given the fact that the patient is not febrile and her blood work otherwise looks okay I do think would be reasonable for patient to be treated with different outpatient antibiotics.  I think both choices are reasonable and discussed this with the patient.  She did choose and felt more comfortable going home which I think is reasonable.   Will switch her to Augmentin.  Discussed with patient return precautions.    ____________________________________________   FINAL CLINICAL IMPRESSION(S) / ED DIAGNOSES  Final diagnoses:  Left lower quadrant abdominal pain  Diverticulitis  Salmonella     Note: This dictation was prepared with Dragon dictation. Any transcriptional errors that result from this process are unintentional     Phineas SemenGoodman, Rolan Wrightsman, MD 04/14/19 1735

## 2019-04-14 NOTE — Progress Notes (Signed)
Wt 189 lb (85.7 kg) Comment: pt reported  BMI 28.74 kg/m    Subjective:    Patient ID: Wendy Rivas, female    DOB: 1965/12/16, 53 y.o.   MRN: [id number]  HPI: Wendy Rivas is a 53 y.o. female  Chief Complaint  Patient presents with  . Abdominal Pain    pt states she is not any better, states she is having extreme nausea and diarrhea    . This visit was completed via WebEx due to the restrictions of the COVID-19 pandemic. All issues as above were discussed and addressed. Physical exam was done as above through visual confirmation on WebEx. If it was felt that the patient should be evaluated in the office, they were directed there. The patient verbally consented to this visit. . Location of the patient: home . Location of the provider: work . Those involved with this call:  . Provider: Roosvelt Maser, PA-C . CMA: Wilhemena Durie, CMA . Front Desk/Registration: Harriet Pho  . Time spent on call: 25 minutes with patient face to face via video conference. More than 50% of this time was spent in counseling and coordination of care. 5 minutes total spent in review of patient's record and preparation of their chart. I verified patient identity using two factors (patient name and date of birth). Patient consents verbally to being seen via telemedicine visit today.   Patient presents today for worsening LLQ abdominal pain, severe N/V/D, and strong odor of diarrhea. Was started on cipro and flagyl for possible diverticulitis 4 days ago and has since been worsening. Denies known fevers, melena, known sick contacts or recent travel. Tolerating small amounts of fluids but too nauseated to eat or drink much. States the abdominal pain is so bad that even taking deep breaths or moving hurts significantly. Does have a hx of diverticulitis.   Relevant past medical, surgical, family and social history reviewed and updated as indicated. Interim medical history since our last visit reviewed. Allergies and  medications reviewed and updated.  Review of Systems  Per HPI unless specifically indicated above     Objective:    Wt 189 lb (85.7 kg) Comment: pt reported  BMI 28.74 kg/m   Wt Readings from Last 3 Encounters:  04/14/19 189 lb (85.7 kg)  04/14/19 189 lb (85.7 kg)  04/12/19 192 lb (87.1 kg)    Physical Exam Vitals signs and nursing note reviewed.  Constitutional:      Appearance: Normal appearance. She is not ill-appearing.  HENT:     Head: Atraumatic.  Eyes:     Extraocular Movements: Extraocular movements intact.     Conjunctiva/sclera: Conjunctivae normal.  Neck:     Musculoskeletal: Normal range of motion and neck supple.  Cardiovascular:     Rate and Rhythm: Normal rate and regular rhythm.     Heart sounds: Normal heart sounds.  Pulmonary:     Effort: Pulmonary effort is normal.     Breath sounds: Normal breath sounds.  Abdominal:     Tenderness: There is abdominal tenderness (significant ttp llq per patient's self palpation).  Musculoskeletal: Normal range of motion.  Skin:    General: Skin is warm and dry.  Neurological:     Mental Status: She is alert and oriented to person, place, and time.  Psychiatric:        Mood and Affect: Mood normal.        Thought Content: Thought content normal.        Judgment: Judgment normal.  Results for orders placed or performed in visit on 01/18/19  UA/M w/rflx Culture, Routine  Result Value Ref Range   Specific Gravity, UA 1.020 1.005 - 1.030   pH, UA 5.0 5.0 - 7.5   Color, UA Yellow Yellow   Appearance Ur Clear Clear   Leukocytes, UA Negative Negative   Protein, UA Negative Negative/Trace   Glucose, UA Negative Negative   Ketones, UA Negative Negative   RBC, UA Negative Negative   Bilirubin, UA Negative Negative   Urobilinogen, Ur 0.2 0.2 - 1.0 mg/dL   Nitrite, UA Negative Negative      Assessment & Plan:   Problem List Items Addressed This Visit    None    Visit Diagnoses    LLQ pain    -  Primary    Nausea vomiting and diarrhea          Patient's sxs progressing despite cipro and flagyl. Discussed need to go to ER for immediate labs/imaging/physical exam. Concern for c. Diff colitis not responding to flagyl or worsening diverticulitis. Pt agreeable to going to ER. [facility] called and given information about patient.   Follow up plan: Return if symptoms worsen or fail to improve.

## 2019-04-14 NOTE — ED Notes (Signed)
STOOL SAMPLE SENT TO LAB

## 2019-04-14 NOTE — ED Notes (Signed)
Dr. Derrill Kay aware of patient positive for salmonella.

## 2019-04-14 NOTE — ED Triage Notes (Addendum)
Pt to ED from PCP with diarrhea and abd pain since Tuesday. PT reporting she was sent for a CT scan and stool sample. Nausea without vomiting. Abd is tender upon palpation. PT has hx of diverticulitis and was prescribed flagyl and Cipro on Wednesday. Pain has since worsened.

## 2019-04-14 NOTE — Discharge Instructions (Signed)
Please seek medical attention for any high fevers, chest pain, shortness of breath, change in behavior, persistent vomiting, bloody stool or any other new or concerning symptoms.  

## 2019-04-21 ENCOUNTER — Encounter: Payer: Self-pay | Admitting: Family Medicine

## 2019-04-21 ENCOUNTER — Ambulatory Visit (INDEPENDENT_AMBULATORY_CARE_PROVIDER_SITE_OTHER): Payer: Medicare HMO | Admitting: Family Medicine

## 2019-04-21 ENCOUNTER — Other Ambulatory Visit: Payer: Self-pay

## 2019-04-21 VITALS — Wt 189.0 lb

## 2019-04-21 DIAGNOSIS — A029 Salmonella infection, unspecified: Secondary | ICD-10-CM

## 2019-04-21 DIAGNOSIS — K5792 Diverticulitis of intestine, part unspecified, without perforation or abscess without bleeding: Secondary | ICD-10-CM

## 2019-04-21 MED ORDER — AMOXICILLIN-POT CLAVULANATE 875-125 MG PO TABS: 1.0000 | ORAL_TABLET | Freq: Two times a day (BID) | ORAL | 0 refills | Status: DC

## 2019-04-21 NOTE — Progress Notes (Signed)
Wt 189 lb (85.7 kg)   BMI 28.74 kg/m    Subjective:    Patient ID: Wendy Rivas, female    DOB: 02-08-1966, 53 y.o.   MRN: [id number]  HPI: Wendy Rivas is a 53 y.o. female  Chief Complaint  Patient presents with  . Nausea    pt states she has still been having nausea. weakness, and diarrhea. States she also has a loss of taste for the past week     . This visit was completed via WebEx due to the restrictions of the COVID-19 pandemic. All issues as above were discussed and addressed. Physical exam was done as above through visual confirmation on WebEx. If it was felt that the patient should be evaluated in the office, they were directed there. The patient verbally consented to this visit. . Location of the patient: home . Location of the provider: work . Those involved with this call:  . Provider: Merrie Roof, PA-C . CMA: Yvonna Alanis, [place] . Front Desk/Registration: Jill Side  . Time spent on call: 15 minutes with patient face to face via video conference. More than 50% of this time was spent in counseling and coordination of care. 5 minutes total spent in review of patient's record and preparation of their chart. I verified patient identity using two factors (patient name and date of birth). Patient consents verbally to being seen via telemedicine visit today.   Was diagnosed with salmonella and diverticulitis without perforation in ER last week. Completed cipro and flagyl regimen and almost done with augmentin given in ER. Feeling moderately improved but still having about 8 episodes of diarrhea a day as well as mild abdominal pain/cramping, weakness and nausea. Denies fevers, melena, vomiting, intolerance to PO.   Relevant past medical, surgical, family and social history reviewed and updated as indicated. Interim medical history since our last visit reviewed. Allergies and medications reviewed and updated.  Review of Systems  Per HPI unless specifically indicated above      Objective:    Wt 189 lb (85.7 kg)   BMI 28.74 kg/m   Wt Readings from Last 3 Encounters:  04/21/19 189 lb (85.7 kg)  04/14/19 189 lb (85.7 kg)  04/14/19 189 lb (85.7 kg)    Physical Exam Vitals signs and nursing note reviewed.  Constitutional:      General: She is not in acute distress.    Appearance: Normal appearance.  HENT:     Head: Atraumatic.     Right Ear: External ear normal.     Left Ear: External ear normal.     Nose: Nose normal. No congestion.     Mouth/Throat:     Mouth: Mucous membranes are moist.     Pharynx: Oropharynx is clear. No posterior oropharyngeal erythema.  Eyes:     Extraocular Movements: Extraocular movements intact.     Conjunctiva/sclera: Conjunctivae normal.  Neck:     Musculoskeletal: Normal range of motion.  Cardiovascular:     Comments: Unable to assess via virtual visit Pulmonary:     Effort: Pulmonary effort is normal. No respiratory distress.  Abdominal:     General: There is no distension.     Tenderness: There is abdominal tenderness (mild lower abdominal ttp per patient's self palpation).  Musculoskeletal: Normal range of motion.  Skin:    General: Skin is dry.     Findings: No erythema.  Neurological:     Mental Status: She is alert and oriented to person, place, and time.  Psychiatric:  Mood and Affect: Mood normal.        Thought Content: Thought content normal.        Judgment: Judgment normal.     Results for orders placed or performed during the hospital encounter of 04/14/19  C difficile quick scan w PCR reflex   Specimen: STOOL  Result Value Ref Range   C Diff antigen POSITIVE (A) NEGATIVE   C Diff toxin NEGATIVE NEGATIVE   C Diff interpretation Results are indeterminate. See PCR results.   Gastrointestinal Panel by PCR , Stool   Specimen: Stool  Result Value Ref Range   Campylobacter species NOT DETECTED NOT DETECTED   Plesimonas shigelloides NOT DETECTED NOT DETECTED   Salmonella species DETECTED  (A) NOT DETECTED   Yersinia enterocolitica NOT DETECTED NOT DETECTED   Vibrio species NOT DETECTED NOT DETECTED   Vibrio cholerae NOT DETECTED NOT DETECTED   Enteroaggregative E coli (EAEC) NOT DETECTED NOT DETECTED   Enteropathogenic E coli (EPEC) NOT DETECTED NOT DETECTED   Enterotoxigenic E coli (ETEC) NOT DETECTED NOT DETECTED   Shiga like toxin producing E coli (STEC) NOT DETECTED NOT DETECTED   Shigella/Enteroinvasive E coli (EIEC) NOT DETECTED NOT DETECTED   Cryptosporidium NOT DETECTED NOT DETECTED   Cyclospora cayetanensis NOT DETECTED NOT DETECTED   Entamoeba histolytica NOT DETECTED NOT DETECTED   Giardia lamblia NOT DETECTED NOT DETECTED   Adenovirus F40/41 NOT DETECTED NOT DETECTED   Astrovirus NOT DETECTED NOT DETECTED   Norovirus GI/GII NOT DETECTED NOT DETECTED   Rotavirus A NOT DETECTED NOT DETECTED   Sapovirus (I, II, IV, and V) NOT DETECTED NOT DETECTED  C. Diff by PCR, Reflexed  Result Value Ref Range   Toxigenic C. Difficile by PCR NEGATIVE NEGATIVE  Lipase, blood  Result Value Ref Range   Lipase 42 11 - 51 U/L  Comprehensive metabolic panel  Result Value Ref Range   Sodium 138 135 - 145 mmol/L   Potassium 3.9 3.5 - 5.1 mmol/L   Chloride 105 98 - 111 mmol/L   CO2 22 22 - 32 mmol/L   Glucose, Bld 149 (H) 70 - 99 mg/dL   BUN 17 6 - 20 mg/dL   Creatinine, Ser 4.540.78 0.44 - 1.00 mg/dL   Calcium 9.1 8.9 - 09.810.3 mg/dL   Total Protein 7.5 6.5 - 8.1 g/dL   Albumin 4.5 3.5 - 5.0 g/dL   AST 22 15 - 41 U/L   ALT 17 0 - 44 U/L   Alkaline Phosphatase 57 38 - 126 U/L   Total Bilirubin 0.8 0.3 - 1.2 mg/dL   GFR calc non Af Amer >60 >60 mL/min   GFR calc Af Amer >60 >60 mL/min   Anion gap 11 5 - 15  CBC  Result Value Ref Range   WBC 8.2 4.0 - 10.5 K/uL   RBC 4.25 3.87 - 5.11 MIL/uL   Hemoglobin 12.1 12.0 - 15.0 g/dL   HCT 11.935.4 (L) 14.736.0 - 82.946.0 %   MCV 83.3 80.0 - 100.0 fL   MCH 28.5 26.0 - 34.0 pg   MCHC 34.2 30.0 - 36.0 g/dL   RDW 56.213.5 13.011.5 - 86.515.5 %   Platelets  193 150 - 400 K/uL   nRBC 0.0 0.0 - 0.2 %      Assessment & Plan:   Problem List Items Addressed This Visit    None    Visit Diagnoses    Salmonella    -  Primary   Slowly improving, complete augmentin, start  probiotics, BRAT diet. 2nd round of abx sent in case worsening over weekend   Diverticulitis       Slowly resolving, BRAT diet, probiotics. Strict return precautions given if sxs worsen       Follow up plan: Return if symptoms worsen or fail to improve.

## 2019-05-09 ENCOUNTER — Other Ambulatory Visit: Payer: Self-pay

## 2019-05-09 ENCOUNTER — Encounter: Payer: Self-pay | Admitting: Family Medicine

## 2019-05-09 ENCOUNTER — Ambulatory Visit (INDEPENDENT_AMBULATORY_CARE_PROVIDER_SITE_OTHER): Payer: Medicare HMO | Admitting: Family Medicine

## 2019-05-09 VITALS — Ht 68.0 in | Wt 193.0 lb

## 2019-05-09 DIAGNOSIS — R110 Nausea: Secondary | ICD-10-CM

## 2019-05-09 DIAGNOSIS — R1030 Lower abdominal pain, unspecified: Secondary | ICD-10-CM

## 2019-05-09 DIAGNOSIS — R197 Diarrhea, unspecified: Secondary | ICD-10-CM | POA: Diagnosis not present

## 2019-05-09 MED ORDER — PROMETHAZINE HCL 25 MG PO TABS: 25.0000 mg | ORAL_TABLET | Freq: Three times a day (TID) | ORAL | 0 refills | Status: DC | PRN

## 2019-05-09 NOTE — Progress Notes (Signed)
Ht 5\' 8"  (1.727 m)   Wt 193 lb (87.5 kg)   BMI 29.35 kg/m    Subjective:    Patient ID: Wendy Rivas, female    DOB: 11-11-1965, 53 y.o.   MRN: [id number]  HPI: Wendy Rivas is a 53 y.o. female  Chief Complaint  Patient presents with  . Abdominal Pain    Picked up antibiotic that was called in previously if patient needed it yesterday. Patient states severe diarrhea, abdominal crampinng and nausea began 2 days ago.  . Diarrhea  . Nausea    . This visit was completed via WebEx due to the restrictions of the COVID-19 pandemic. All issues as above were discussed and addressed. Physical exam was done as above through visual confirmation on WebEx. If it was felt that the patient should be evaluated in the office, they were directed there. The patient verbally consented to this visit. . Location of the patient: home . Location of the provider: home . Those involved with this call:  . Provider: Merrie Roof, PA-C . CMA: Merilyn Baba, [place] . Front Desk/Registration: Jill Side  . Time spent on call: 15 minutes with patient face to face via video conference. More than 50% of this time was spent in counseling and coordination of care. 5 minutes total spent in review of patient's record and preparation of their chart. I verified patient identity using two factors (patient name and date of birth). Patient consents verbally to being seen via telemedicine visit today.   Presenting today for recurring abdominal pain and diarrhea. Was recently diagnosed with diverticulitis and salmonella. Has completed flagyl and augmentin courses. Had started feeling better for about a week or so, still with diarrhea but the weakness and nausea was resolved. Started back over the weekend with increasing diarrhea and last night the stomach cramping returned significantly. Picked up the antibiotics yesterday that were sent in case sxs weren't resolved after first course of augmentin. Has been trying to eat a bland  diet. Tolerating PO. Denies fevers, chills, body aches, melena, vomiting but states nausea is significant.   Relevant past medical, surgical, family and social history reviewed and updated as indicated. Interim medical history since our last visit reviewed. Allergies and medications reviewed and updated.  Review of Systems  Per HPI unless specifically indicated above     Objective:    Ht 5\' 8"  (1.727 m)   Wt 193 lb (87.5 kg)   BMI 29.35 kg/m   Wt Readings from Last 3 Encounters:  05/09/19 193 lb (87.5 kg)  04/21/19 189 lb (85.7 kg)  04/14/19 189 lb (85.7 kg)    Physical Exam Vitals signs and nursing note reviewed.  Constitutional:      General: She is not in acute distress.    Appearance: Normal appearance.  HENT:     Head: Atraumatic.     Right Ear: External ear normal.     Left Ear: External ear normal.     Nose: Nose normal. No congestion.     Mouth/Throat:     Mouth: Mucous membranes are moist.     Pharynx: Oropharynx is clear. No posterior oropharyngeal erythema.  Eyes:     Extraocular Movements: Extraocular movements intact.     Conjunctiva/sclera: Conjunctivae normal.  Neck:     Musculoskeletal: Normal range of motion.  Cardiovascular:     Comments: Unable to assess via virtual visit Pulmonary:     Effort: Pulmonary effort is normal. No respiratory distress.  Abdominal:  Tenderness: There is abdominal tenderness (mild diffuse lower abdominal ttp per patient's self palpation).  Musculoskeletal: Normal range of motion.  Skin:    General: Skin is dry.     Findings: No erythema.  Neurological:     Mental Status: She is alert and oriented to person, place, and time.  Psychiatric:        Mood and Affect: Mood normal.        Thought Content: Thought content normal.        Judgment: Judgment normal.     Results for orders placed or performed during the hospital encounter of 04/14/19  C difficile quick scan w PCR reflex   Specimen: STOOL  Result Value Ref  Range   C Diff antigen POSITIVE (A) NEGATIVE   C Diff toxin NEGATIVE NEGATIVE   C Diff interpretation Results are indeterminate. See PCR results.   Gastrointestinal Panel by PCR , Stool   Specimen: Stool  Result Value Ref Range   Campylobacter species NOT DETECTED NOT DETECTED   Plesimonas shigelloides NOT DETECTED NOT DETECTED   Salmonella species DETECTED (A) NOT DETECTED   Yersinia enterocolitica NOT DETECTED NOT DETECTED   Vibrio species NOT DETECTED NOT DETECTED   Vibrio cholerae NOT DETECTED NOT DETECTED   Enteroaggregative E coli (EAEC) NOT DETECTED NOT DETECTED   Enteropathogenic E coli (EPEC) NOT DETECTED NOT DETECTED   Enterotoxigenic E coli (ETEC) NOT DETECTED NOT DETECTED   Shiga like toxin producing E coli (STEC) NOT DETECTED NOT DETECTED   Shigella/Enteroinvasive E coli (EIEC) NOT DETECTED NOT DETECTED   Cryptosporidium NOT DETECTED NOT DETECTED   Cyclospora cayetanensis NOT DETECTED NOT DETECTED   Entamoeba histolytica NOT DETECTED NOT DETECTED   Giardia lamblia NOT DETECTED NOT DETECTED   Adenovirus F40/41 NOT DETECTED NOT DETECTED   Astrovirus NOT DETECTED NOT DETECTED   Norovirus GI/GII NOT DETECTED NOT DETECTED   Rotavirus A NOT DETECTED NOT DETECTED   Sapovirus (I, II, IV, and V) NOT DETECTED NOT DETECTED  C. Diff by PCR, Reflexed  Result Value Ref Range   Toxigenic C. Difficile by PCR NEGATIVE NEGATIVE  Lipase, blood  Result Value Ref Range   Lipase 42 11 - 51 U/L  Comprehensive metabolic panel  Result Value Ref Range   Sodium 138 135 - 145 mmol/L   Potassium 3.9 3.5 - 5.1 mmol/L   Chloride 105 98 - 111 mmol/L   CO2 22 22 - 32 mmol/L   Glucose, Bld 149 (H) 70 - 99 mg/dL   BUN 17 6 - 20 mg/dL   Creatinine, Ser 4.090.78 0.44 - 1.00 mg/dL   Calcium 9.1 8.9 - 81.110.3 mg/dL   Total Protein 7.5 6.5 - 8.1 g/dL   Albumin 4.5 3.5 - 5.0 g/dL   AST 22 15 - 41 U/L   ALT 17 0 - 44 U/L   Alkaline Phosphatase 57 38 - 126 U/L   Total Bilirubin 0.8 0.3 - 1.2 mg/dL   GFR  calc non Af Amer >60 >60 mL/min   GFR calc Af Amer >60 >60 mL/min   Anion gap 11 5 - 15  CBC  Result Value Ref Range   WBC 8.2 4.0 - 10.5 K/uL   RBC 4.25 3.87 - 5.11 MIL/uL   Hemoglobin 12.1 12.0 - 15.0 g/dL   HCT 91.435.4 (L) 78.236.0 - 95.646.0 %   MCV 83.3 80.0 - 100.0 fL   MCH 28.5 26.0 - 34.0 pg   MCHC 34.2 30.0 - 36.0 g/dL   RDW  13.5 11.5 - 15.5 %   Platelets 193 150 - 400 K/uL   nRBC 0.0 0.0 - 0.2 %      Assessment & Plan:   Problem List Items Addressed This Visit    None    Visit Diagnoses    Lower abdominal pain    -  Primary   Diarrhea, unspecified type       Nausea        Has only taken one dose so far of new script for augmentin. Complete this course, probiotics, bland diet, push fluids. Will send phenergan for prn use for nausea. Declines referral to GI at this time for further management as she [place]. Strict return precautions reviewed.    Follow up plan: Return if symptoms worsen or fail to improve.

## 2019-05-16 ENCOUNTER — Ambulatory Visit (INDEPENDENT_AMBULATORY_CARE_PROVIDER_SITE_OTHER): Payer: Medicare HMO | Admitting: Family Medicine

## 2019-05-16 ENCOUNTER — Encounter: Payer: Self-pay | Admitting: Family Medicine

## 2019-05-16 ENCOUNTER — Other Ambulatory Visit: Payer: Self-pay

## 2019-05-16 DIAGNOSIS — M1A0790 Idiopathic chronic gout, unspecified ankle and foot, without tophus (tophi): Secondary | ICD-10-CM | POA: Diagnosis not present

## 2019-05-16 MED ORDER — COLCHICINE 0.6 MG PO TABS: ORAL_TABLET | ORAL | 0 refills | Status: AC

## 2019-05-16 NOTE — Assessment & Plan Note (Signed)
In acute flare, likely due to her sallmonella. Continues with diarrhea. Will treat with colchicine. Call with any concerns. If not getting better, let us know. Declines referral to GI as she's moving to [place] in 3 weeks.

## 2019-05-16 NOTE — Progress Notes (Signed)
Ht 5\' 8"[id number]$  (1.727 m)   Wt 193 lb (87.5 kg)   BMI 29.35 kg/m    Subjective:    Patient ID: Wendy MaiersSharon Rivas, female    DOB: [birth year], 53 y.o.   MRN: [id number]  HPI: Wendy MaiersSharon Rivas is a 53 y.o. female  Chief Complaint  Patient presents with  . Toe Pain    Patient states she has gout. Both big toes, right side is worse. Patient states they're swollen and red. Ongoing 3 days.   . Foot Pain   GOUT Duration: 3 days Right 1st metatarsophalangeal pain: yes Left 1st metatarsophalangeal pain: very mild Right knee pain: no Left knee pain: no Severity: moderate  Quality: sharp Swelling: yes Redness: yes Trauma: no Recent dietary change or indiscretion: no Fevers: no Nausea/vomiting: no Status:  worse Treatments attempted: none  Relevant past medical, surgical, family and social history reviewed and updated as indicated. Interim medical history since our last visit reviewed. Allergies and medications reviewed and updated.  Review of Systems  Constitutional: Negative.   Respiratory: Negative.   Cardiovascular: Negative.   Musculoskeletal: Positive for arthralgias. Negative for back pain, gait problem, joint swelling, myalgias, neck pain and neck stiffness.  Skin: Negative.   Neurological: Negative.   Psychiatric/Behavioral: Negative.     Per HPI unless specifically indicated above     Objective:    Ht 5\' 8"  (1.727 m)   Wt 193 lb (87.5 kg)   BMI 29.35 kg/m   Wt Readings from Last 3 Encounters:  05/16/19 193 lb (87.5 kg)  05/09/19 193 lb (87.5 kg)  04/21/19 189 lb (85.7 kg)    Physical Exam Vitals signs and nursing note reviewed.  Constitutional:      General: She is not in acute distress.    Appearance: Normal appearance. She is not ill-appearing, toxic-appearing or diaphoretic.  HENT:     Head: Normocephalic and atraumatic.     Right Ear: External ear normal.     Left Ear: External ear normal.     Nose: Nose normal.     Mouth/Throat:     Mouth: Mucous membranes  are moist.     Pharynx: Oropharynx is clear.  Eyes:     General: No scleral icterus.       Right eye: No discharge.        Left eye: No discharge.     Conjunctiva/sclera: Conjunctivae normal.     Pupils: Pupils are equal, round, and reactive to light.  Neck:     Musculoskeletal: Normal range of motion.  Pulmonary:     Effort: Pulmonary effort is normal. No respiratory distress.     Comments: Speaking in full sentences Musculoskeletal: Normal range of motion.        General: Swelling and tenderness (on patient palption- visually confirmed) present.  Skin:    Coloration: Skin is not jaundiced or pale.     Findings: No bruising, erythema, lesion or rash.  Neurological:     Mental Status: She is alert and oriented to person, place, and time. Mental status is at baseline.  Psychiatric:        Mood and Affect: Mood normal.        Behavior: Behavior normal.        Thought Content: Thought content normal.        Judgment: Judgment normal.     Results for orders placed or performed during the hospital encounter of 04/14/19  C difficile quick scan w PCR reflex   Specimen: STOOL  Result Value Ref Range   C Diff antigen POSITIVE (A) NEGATIVE   C Diff toxin NEGATIVE NEGATIVE   C Diff interpretation Results are indeterminate. See PCR results.   Gastrointestinal Panel by PCR , Stool   Specimen: Stool  Result Value Ref Range   Campylobacter species NOT DETECTED NOT DETECTED   Plesimonas shigelloides NOT DETECTED NOT DETECTED   Salmonella species DETECTED (A) NOT DETECTED   Yersinia enterocolitica NOT DETECTED NOT DETECTED   Vibrio species NOT DETECTED NOT DETECTED   Vibrio cholerae NOT DETECTED NOT DETECTED   Enteroaggregative E coli (EAEC) NOT DETECTED NOT DETECTED   Enteropathogenic E coli (EPEC) NOT DETECTED NOT DETECTED   Enterotoxigenic E coli (ETEC) NOT DETECTED NOT DETECTED   Shiga like toxin producing E coli (STEC) NOT DETECTED NOT DETECTED   Shigella/Enteroinvasive E coli  (EIEC) NOT DETECTED NOT DETECTED   Cryptosporidium NOT DETECTED NOT DETECTED   Cyclospora cayetanensis NOT DETECTED NOT DETECTED   Entamoeba histolytica NOT DETECTED NOT DETECTED   Giardia lamblia NOT DETECTED NOT DETECTED   Adenovirus F40/41 NOT DETECTED NOT DETECTED   Astrovirus NOT DETECTED NOT DETECTED   Norovirus GI/GII NOT DETECTED NOT DETECTED   Rotavirus A NOT DETECTED NOT DETECTED   Sapovirus (I, II, IV, and V) NOT DETECTED NOT DETECTED  C. Diff by PCR, Reflexed  Result Value Ref Range   Toxigenic C. Difficile by PCR NEGATIVE NEGATIVE  Lipase, blood  Result Value Ref Range   Lipase 42 11 - 51 U/L  Comprehensive metabolic panel  Result Value Ref Range   Sodium 138 135 - 145 mmol/L   Potassium 3.9 3.5 - 5.1 mmol/L   Chloride 105 98 - 111 mmol/L   CO2 22 22 - 32 mmol/L   Glucose, Bld 149 (H) 70 - 99 mg/dL   BUN 17 6 - 20 mg/dL   Creatinine, Ser 1.610.78 0.44 - 1.00 mg/dL   Calcium 9.1 8.9 - 09.610.3 mg/dL   Total Protein 7.5 6.5 - 8.1 g/dL   Albumin 4.5 3.5 - 5.0 g/dL   AST 22 15 - 41 U/L   ALT 17 0 - 44 U/L   Alkaline Phosphatase 57 38 - 126 U/L   Total Bilirubin 0.8 0.3 - 1.2 mg/dL   GFR calc non Af Amer >60 >60 mL/min   GFR calc Af Amer >60 >60 mL/min   Anion gap 11 5 - 15  CBC  Result Value Ref Range   WBC 8.2 4.0 - 10.5 K/uL   RBC 4.25 3.87 - 5.11 MIL/uL   Hemoglobin 12.1 12.0 - 15.0 g/dL   HCT 04.535.4 (L) 40.936.0 - 81.146.0 %   MCV 83.3 80.0 - 100.0 fL   MCH 28.5 26.0 - 34.0 pg   MCHC 34.2 30.0 - 36.0 g/dL   RDW 91.413.5 78.211.5 - 95.615.5 %   Platelets 193 150 - 400 K/uL   nRBC 0.0 0.0 - 0.2 %      Assessment & Plan:   Problem List Items Addressed This Visit      Other   Gout    In acute flare, likely due to her sallmonella. Continues with diarrhea. Will treat with colchicine. Call with any concerns. If not getting better, let us know. Declines referral to GI as she's moving to [place] in 3 weeks.           Follow up plan: Return if symptoms worsen or fail to improve.    . This visit was completed via D[employer] due to  the restrictions of the COVID-19 pandemic. All issues as above were discussed and addressed. Physical exam was done as above through visual confirmation on Doximity. If it was felt that the patient should be evaluated in the office, they were directed there. The patient verbally consented to this visit. . Location of the patient: home . Location of the provider: work . Those involved with this call:  . Provider: Park Liter, DO . CMA: Merilyn Baba, CMA . Front Desk/Registration: Don Perking  . Time spent on call: 15 minutes with patient face to face via video conference. More than 50% of this time was spent in counseling and coordination of care. 23 minutes total spent in review of patient's record and preparation of their chart.

## 2019-05-19 ENCOUNTER — Ambulatory Visit (INDEPENDENT_AMBULATORY_CARE_PROVIDER_SITE_OTHER): Payer: Medicare HMO | Admitting: Family Medicine

## 2019-05-19 ENCOUNTER — Encounter: Payer: Self-pay | Admitting: Family Medicine

## 2019-05-19 ENCOUNTER — Other Ambulatory Visit: Payer: Self-pay

## 2019-05-19 VITALS — BP 112/75 | HR 87 | Temp 98.2°F | Ht 68.0 in | Wt 195.0 lb

## 2019-05-19 DIAGNOSIS — K219 Gastro-esophageal reflux disease without esophagitis: Secondary | ICD-10-CM | POA: Diagnosis not present

## 2019-05-19 DIAGNOSIS — M1A0790 Idiopathic chronic gout, unspecified ankle and foot, without tophus (tophi): Secondary | ICD-10-CM

## 2019-05-19 DIAGNOSIS — R079 Chest pain, unspecified: Secondary | ICD-10-CM

## 2019-05-19 MED ORDER — INDOMETHACIN 50 MG PO CAPS: 50.0000 mg | ORAL_CAPSULE | Freq: Two times a day (BID) | ORAL | 1 refills | Status: AC | PRN

## 2019-05-19 MED ORDER — ALLOPURINOL 300 MG PO TABS: 300.0000 mg | ORAL_TABLET | Freq: Every day | ORAL | 2 refills | Status: AC

## 2019-05-19 NOTE — Assessment & Plan Note (Signed)
Start indomethacin prn, stay hydrated, increase allopurinol to 300 mg daily. Recheck labs as soon as she gets settled in [place].

## 2019-05-19 NOTE — Assessment & Plan Note (Signed)
Unclear if her CP is reflux related at this time, declining further workup. Continue protonix, start bland diet, TUMs prn

## 2019-05-19 NOTE — Progress Notes (Signed)
BP 112/75   Pulse 87   Temp 98.2 F (36.8 C) (Oral)   Ht 5\' 8"  (1.727 m)   Wt 195 lb (88.5 kg)   SpO2 95%   BMI 29.65 kg/m    Subjective:    Patient ID: Wendy Rivas, female    DOB: 07/21/1966, 53 y.o.   MRN: [id number]  HPI: Wendy Rivas is a 53 y.o. female  Chief Complaint  Patient presents with  . Gout    right toe    Right great toe red, swollen, and significantly painful x 5 days. Was seen several days ago for this here in the office and given colchicine which she states only made it numb. Took some naproxen yesterday which did seem to help. Seems a bit better today but still hurting. On allopurinol daily 100 mg. Has not changed diet but has had chronic diarrhea the past month or so getting over salmonella which she thinks probably triggered this.   Chest pain over the past few weeks intermittently in center of chest. The other day while eating a bite of food felt like it got stuck in her chest and caused a sharp pain. Also under a lot of stress lately. On protonix and doesn't feel like she's having any heartburn sxs. Does not want to go to ER or do any testing for this today.   Moving next week so does not want any referrals or testing, just plans to get established in [place] to get things taken care of.   Relevant past medical, surgical, family and social history reviewed and updated as indicated. Interim medical history since our last visit reviewed. Allergies and medications reviewed and updated.  Review of Systems  Per HPI unless specifically indicated above     Objective:    BP 112/75   Pulse 87   Temp 98.2 F (36.8 C) (Oral)   Ht 5\' 8"  (1.727 m)   Wt 195 lb (88.5 kg)   SpO2 95%   BMI 29.65 kg/m   Wt Readings from Last 3 Encounters:  05/19/19 195 lb (88.5 kg)  05/16/19 193 lb (87.5 kg)  05/09/19 193 lb (87.5 kg)    Physical Exam Vitals signs and nursing note reviewed.  Constitutional:      Appearance: Normal appearance. She is not ill-appearing.  HENT:      Head: Atraumatic.  Eyes:     Extraocular Movements: Extraocular movements intact.     Conjunctiva/sclera: Conjunctivae normal.  Neck:     Musculoskeletal: Normal range of motion and neck supple.  Cardiovascular:     Rate and Rhythm: Normal rate and regular rhythm.     Heart sounds: Normal heart sounds.  Pulmonary:     Effort: Pulmonary effort is normal.     Breath sounds: Normal breath sounds.  Musculoskeletal: Normal range of motion.        General: Swelling (and ttp and erythema right great toe) present.  Skin:    General: Skin is warm and dry.  Neurological:     Mental Status: She is alert and oriented to person, place, and time.  Psychiatric:        Mood and Affect: Mood normal.        Thought Content: Thought content normal.        Judgment: Judgment normal.     Results for orders placed or performed during the hospital encounter of 04/14/19  C difficile quick scan w PCR reflex   Specimen: STOOL  Result Value Ref Range  C Diff antigen POSITIVE (A) NEGATIVE   C Diff toxin NEGATIVE NEGATIVE   C Diff interpretation Results are indeterminate. See PCR results.   Gastrointestinal Panel by PCR , Stool   Specimen: Stool  Result Value Ref Range   Campylobacter species NOT DETECTED NOT DETECTED   Plesimonas shigelloides NOT DETECTED NOT DETECTED   Salmonella species DETECTED (A) NOT DETECTED   Yersinia enterocolitica NOT DETECTED NOT DETECTED   Vibrio species NOT DETECTED NOT DETECTED   Vibrio cholerae NOT DETECTED NOT DETECTED   Enteroaggregative E coli (EAEC) NOT DETECTED NOT DETECTED   Enteropathogenic E coli (EPEC) NOT DETECTED NOT DETECTED   Enterotoxigenic E coli (ETEC) NOT DETECTED NOT DETECTED   Shiga like toxin producing E coli (STEC) NOT DETECTED NOT DETECTED   Shigella/Enteroinvasive E coli (EIEC) NOT DETECTED NOT DETECTED   Cryptosporidium NOT DETECTED NOT DETECTED   Cyclospora cayetanensis NOT DETECTED NOT DETECTED   Entamoeba histolytica NOT DETECTED NOT  DETECTED   Giardia lamblia NOT DETECTED NOT DETECTED   Adenovirus F40/41 NOT DETECTED NOT DETECTED   Astrovirus NOT DETECTED NOT DETECTED   Norovirus GI/GII NOT DETECTED NOT DETECTED   Rotavirus A NOT DETECTED NOT DETECTED   Sapovirus (I, II, IV, and V) NOT DETECTED NOT DETECTED  C. Diff by PCR, Reflexed  Result Value Ref Range   Toxigenic C. Difficile by PCR NEGATIVE NEGATIVE  Lipase, blood  Result Value Ref Range   Lipase 42 11 - 51 U/L  Comprehensive metabolic panel  Result Value Ref Range   Sodium 138 135 - 145 mmol/L   Potassium 3.9 3.5 - 5.1 mmol/L   Chloride 105 98 - 111 mmol/L   CO2 22 22 - 32 mmol/L   Glucose, Bld 149 (H) 70 - 99 mg/dL   BUN 17 6 - 20 mg/dL   Creatinine, Ser 0.78 0.44 - 1.00 mg/dL   Calcium 9.1 8.9 - 10.3 mg/dL   Total Protein 7.5 6.5 - 8.1 g/dL   Albumin 4.5 3.5 - 5.0 g/dL   AST 22 15 - 41 U/L   ALT 17 0 - 44 U/L   Alkaline Phosphatase 57 38 - 126 U/L   Total Bilirubin 0.8 0.3 - 1.2 mg/dL   GFR calc non Af Amer >60 >60 mL/min   GFR calc Af Amer >60 >60 mL/min   Anion gap 11 5 - 15  CBC  Result Value Ref Range   WBC 8.2 4.0 - 10.5 K/uL   RBC 4.25 3.87 - 5.11 MIL/uL   Hemoglobin 12.1 12.0 - 15.0 g/dL   HCT 35.4 (L) 36.0 - 46.0 %   MCV 83.3 80.0 - 100.0 fL   MCH 28.5 26.0 - 34.0 pg   MCHC 34.2 30.0 - 36.0 g/dL   RDW 13.5 11.5 - 15.5 %   Platelets 193 150 - 400 K/uL   nRBC 0.0 0.0 - 0.2 %      Assessment & Plan:   Problem List Items Addressed This Visit      Digestive   GERD (gastroesophageal reflux disease)    Unclear if her CP is reflux related at this time, declining further workup. Continue protonix, start bland diet, TUMs prn        Other   Gout - Primary    Start indomethacin prn, stay hydrated, increase allopurinol to 300 mg daily. Recheck labs as soon as she gets settled in [place].        Other Visit Diagnoses    Chest pain, unspecified type  Declines EKG, labs or going to the ER. Discussed my recommendation to go to ER  or at minimum do EKG. Pt agreeable to go to ER if worsening in future       Follow up plan: Return if symptoms worsen or fail to improve, for [place] out of state, will est with PCP ASAP.

## 2019-07-13 ENCOUNTER — Other Ambulatory Visit: Payer: Self-pay | Admitting: Family Medicine

## 2019-07-13 NOTE — Telephone Encounter (Signed)
Approved per protocol.  Requested Prescriptions  Pending Prescriptions Disp Refills  . montelukast (SINGULAIR) 10 MG tablet [Pharmacy Med Name: Montelukast Sodium 10 MG Oral Tablet] 30 tablet 0    Sig: TAKE 1 TABLET BY MOUTH AT BEDTIME     Pulmonology:  Leukotriene Inhibitors Passed - 07/13/2019  2:36 PM      Passed - Valid encounter within last 12 months    Recent Outpatient Visits          1 month ago Chronic idiopathic gout involving toe without tophus, unspecified laterality   [facility] Volney American, PA-C   1 month ago Chronic idiopathic gout involving toe without tophus, unspecified laterality   [place], [place], [place]   2 months ago Lower abdominal pain   [facility] Merrie Roof [place], [place]   2 months ago [place], [place], [place]   3 months ago LLQ pain   [place], [place], [place]

## 2019-07-28 IMAGING — CT CT RENAL STONE PROTOCOL
2 of 4 series · 16 of 46 positions shown, 18 images · non-contrast
Comparison: None.

CLINICAL DATA: LLQ abdominal pain that started yesterday. Pt states
that she has had nausea but no vomiting or diarrhea. Pt denies
fever. Pt states that she has had some dysuria and frequency. Pt
reports hx/o kidney stones, states that does not feel like her pain
from kidney stones. Pt reports that her pain is worse with movement.
Pt is in NAD at this time.

EXAM:
CT ABDOMEN AND PELVIS WITHOUT CONTRAST
TECHNIQUE: Multidetector CT imaging of the abdomen and pelvis was performed
following the standard protocol without IV contrast.

[Series 2: stone full standard · axial · 0.82mm/px · z∈[-1172,-702]mm · 13 of 104 slices shown, 15 images]
[im 5/104  soft-tissue]
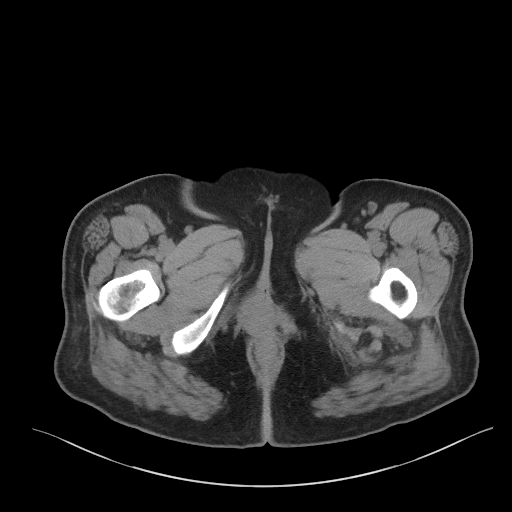
[im 5/104  bone]
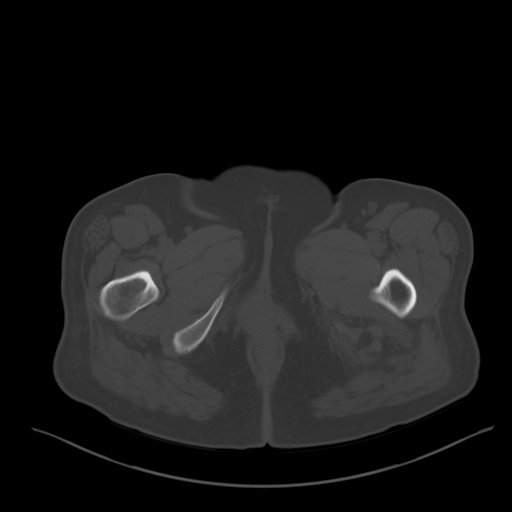
[im 13/104  soft-tissue]
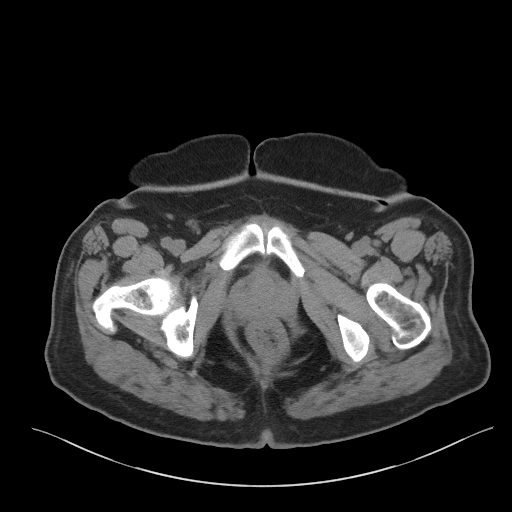
[im 21/104  soft-tissue]
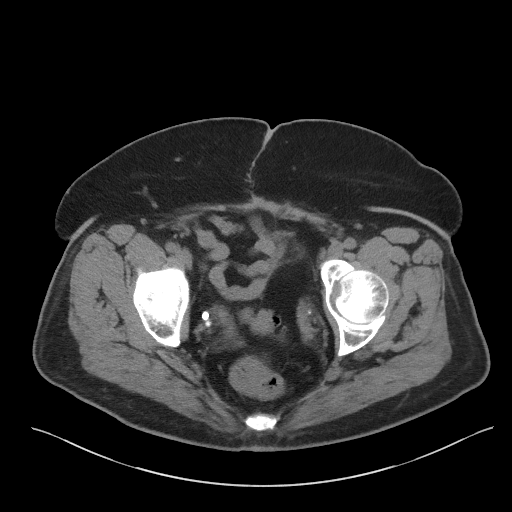
[im 29/104  soft-tissue]
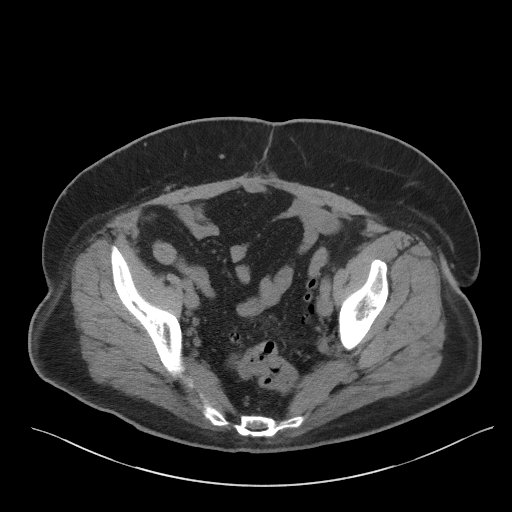
[im 38/104  soft-tissue]
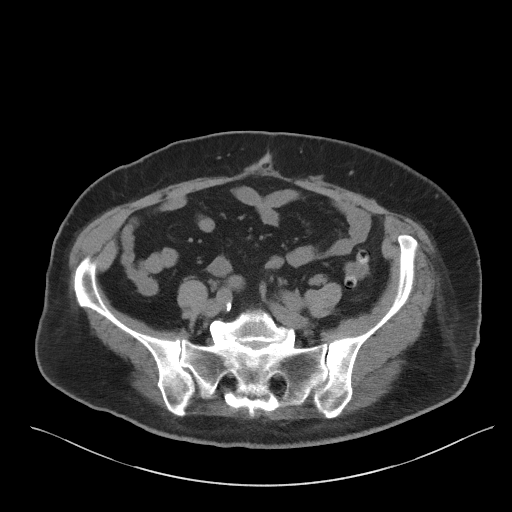
[im 46/104  soft-tissue]
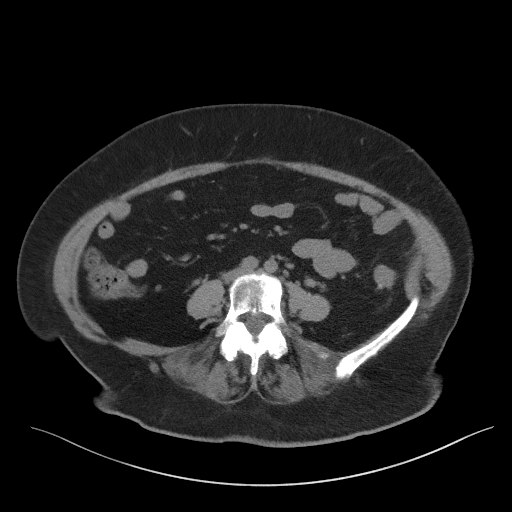
[im 54/104  soft-tissue]
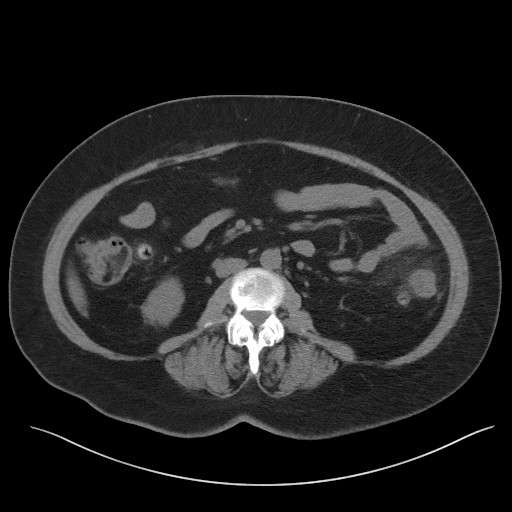
[im 58/104  soft-tissue]
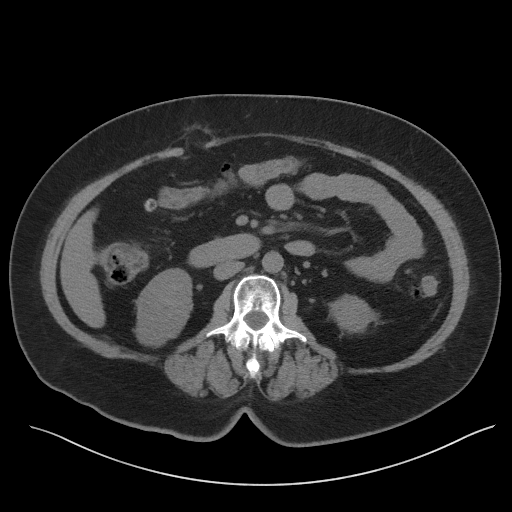
[im 66/104  soft-tissue]
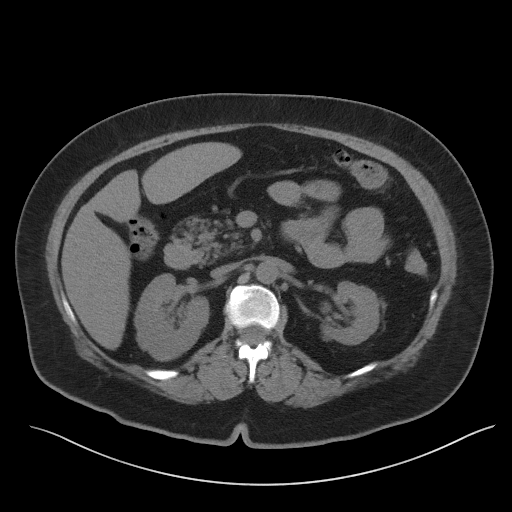
[im 66/104  bone]
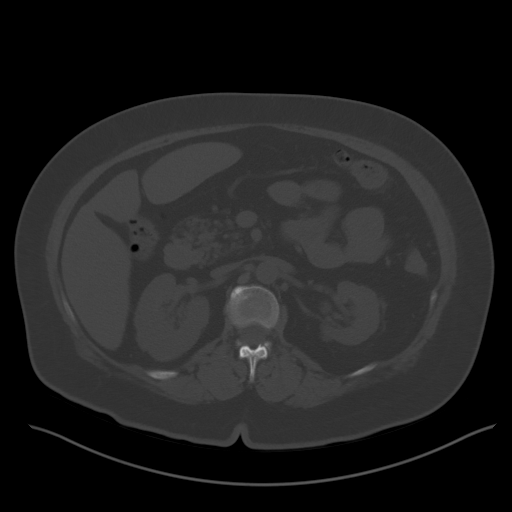
[im 75/104  soft-tissue]
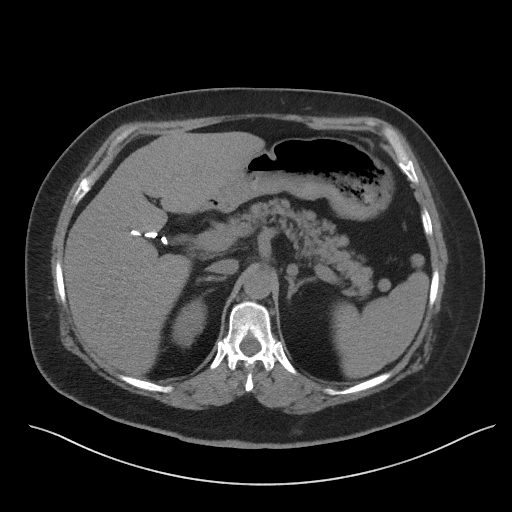
[im 83/104  soft-tissue]
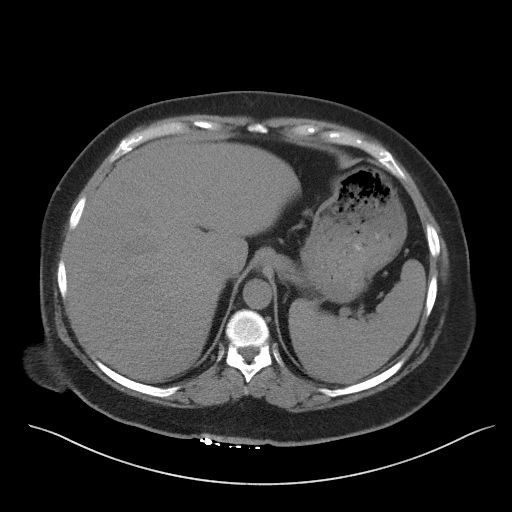
[im 91/104  soft-tissue]
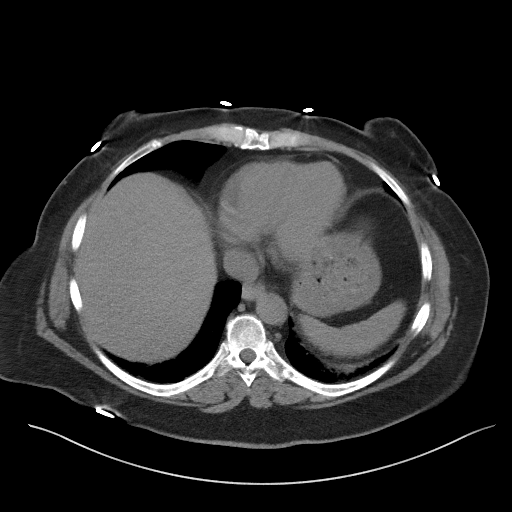
[im 99/104  soft-tissue]
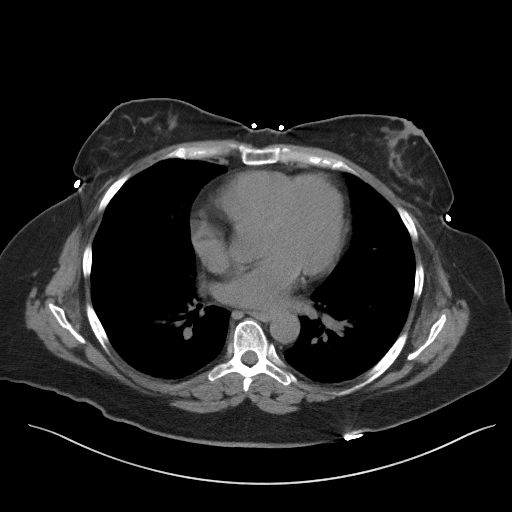

[Series 5: coronal · coronal · 0.85mm/px · 3 of 150 slices shown]
[im 50/150  soft-tissue]
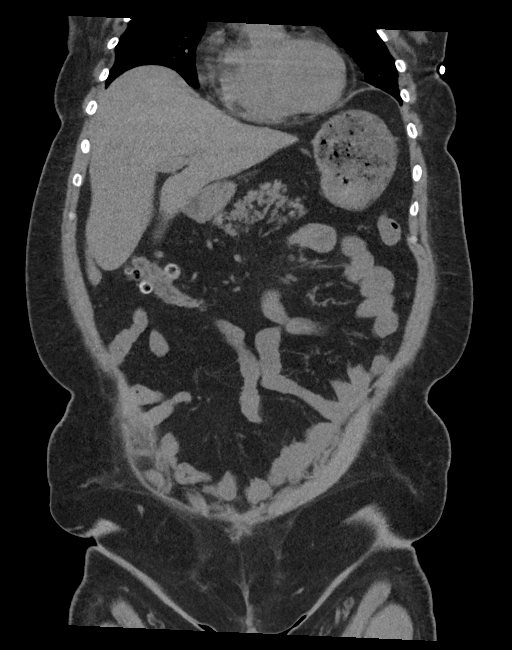
[im 67/150  soft-tissue]
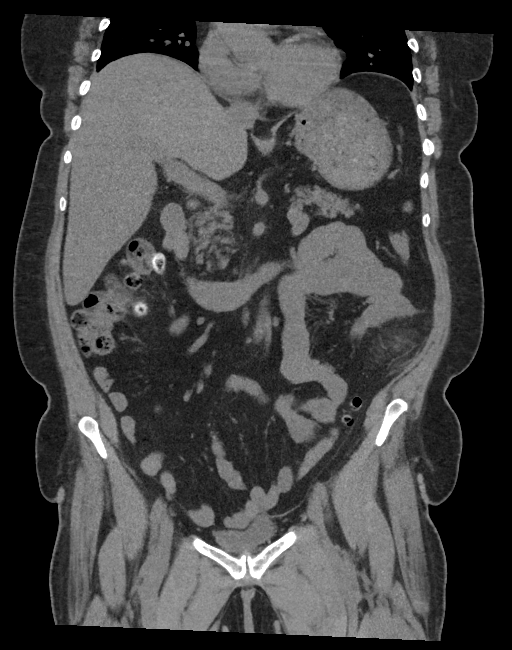
[im 83/150  soft-tissue]
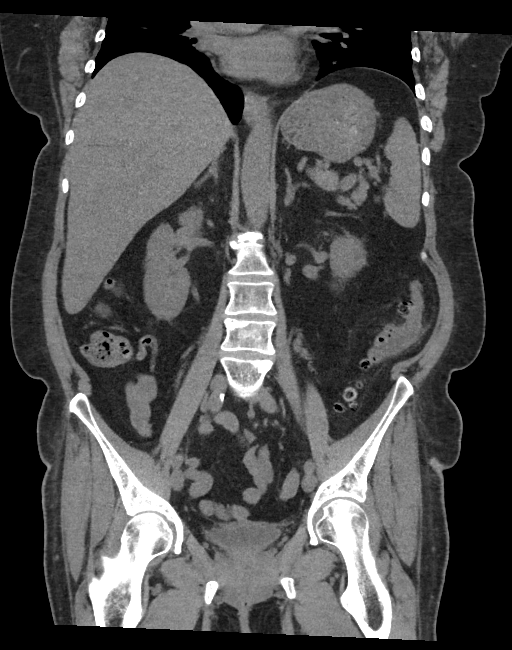

[16 of 46 positions shown; findings below may reference images not displayed]

FINDINGS: Lower chest: No acute abnormality.

Hepatobiliary: No focal liver abnormality is seen. Status post
cholecystectomy. No biliary dilatation.

Pancreas: Mild atrophy.  No mass or ductal dilatation.

Spleen: Normal in size without focal abnormality. Small accessory
splenules.

Adrenals/Urinary Tract: Normal adrenals. Mild diffuse left renal
parenchymal loss, 9.4 cm in length compared to 12.1 cm on the right.
1 mm calcification in the upper pole right renal collecting system.
No hydronephrosis. Urinary bladder is incompletely distended.

Stomach/Bowel: Stomach is physiologically distended. Small bowel is
nondilated. Normal appendix. The colon is nondilated. Scattered
diverticula throughout the colon. Inflammatory/edematous changes
around the distal descending segment. No abscess.

Vascular/Lymphatic: No significant vascular findings are present. No
enlarged abdominal or pelvic lymph nodes.

Reproductive: Post hysterectomy. No adnexal masses. Somewhat lobular
tissue along the course of the left gonadal vein, over length of
cm, measuring 1.3 - 1.6 cm transverse diameter, possibly venous
varicosities.

Other: No ascites.  No free air.

Musculoskeletal: Right upper quadrant hernia containing only
mesenteric fat. Midline postop changes in the anterior abdominal
wall. Spondylitic changes in the lumbar spine. Negative for fracture
or worrisome bone lesion.
IMPRESSION: 1. Extensive colonic diverticulosis with distal descending
diverticulitis, no abscess.
2. Left renal parenchymal loss suggesting renal artery stenosis or
prior insult.
3. Right nephrolithiasis.  No hydronephrosis.

## 2019-07-31 ENCOUNTER — Other Ambulatory Visit: Payer: Self-pay | Admitting: Family Medicine

## 2019-07-31 MED ORDER — TRAZODONE HCL 150 MG PO TABS: 150.0000 mg | ORAL_TABLET | Freq: Every day | ORAL | 1 refills | Status: AC

## 2019-07-31 MED ORDER — PANTOPRAZOLE SODIUM 40 MG PO TBEC: 40.0000 mg | DELAYED_RELEASE_TABLET | Freq: Every day | ORAL | 1 refills | Status: AC

## 2019-07-31 NOTE — Telephone Encounter (Signed)
Medication refill: traZODone (DESYREL) 150 MG tablet [[id number]  pantoprazole (PROTONIX) 40 MG tablet [[id number]]     Pharmacy:  [facility], [place] [phone] (Phone) [phone] (Fax)   Would like enough until she can get a new pcp.

## 2019-07-31 NOTE — Telephone Encounter (Signed)
Requested medication (s) are due for refill today: yes  Requested medication (s) are on the active medication list: yes  Last refill:  01/18/2019  Future visit scheduled: no  Notes to clinic:  Review for refill   Requested Prescriptions  Pending Prescriptions Disp Refills   traZODone (DESYREL) 150 MG tablet 90 tablet 1    Sig: Take 1 tablet (150 mg total) by mouth at bedtime.     Psychiatry: Antidepressants - Serotonin Modulator Passed - 07/31/2019  1:10 PM      Passed - Valid encounter within last 6 months    Recent Outpatient Visits          2 months ago Chronic idiopathic gout involving toe without tophus, unspecified laterality   [facility] Volney American, [place]   2 months ago Chronic idiopathic gout involving toe without tophus, unspecified laterality   [place], [place], [place]   2 months ago Lower abdominal pain   [place], [place], [place]   3 months ago [place], [place], [place]   3 months ago LLQ pain   [place], [place], [place]             Passed - Completed PHQ-2 or PHQ-9 in the last 360 days.       pantoprazole (PROTONIX) 40 MG tablet 90 tablet 1    Sig: Take 1 tablet (40 mg total) by mouth daily.     Gastroenterology: Proton Pump Inhibitors Passed - 07/31/2019  1:10 PM      Passed - Valid encounter within last 12 months    Recent Outpatient Visits          2 months ago Chronic idiopathic gout involving toe without tophus, unspecified laterality   [facility] Volney American, [place]   2 months ago Chronic idiopathic gout involving toe without tophus, unspecified laterality   [place], [place], [place]   2 months ago Lower abdominal pain   [place], [place], [place]   3 months ago [place], [place], [place]   3  months ago LLQ pain   [place], [place], [place]
